# Patient Record
Sex: Female | Born: 1945 | Race: White | Hispanic: No | Marital: Married | Smoking: Former smoker
Health system: Southern US, Community
[De-identification: ages and names within clinical notes are randomized; demographics above are authoritative.]

## PROBLEM LIST (undated history)

## (undated) DIAGNOSIS — Z8719 Personal history of other diseases of the digestive system: Secondary | ICD-10-CM

## (undated) DIAGNOSIS — K219 Gastro-esophageal reflux disease without esophagitis: Secondary | ICD-10-CM

## (undated) DIAGNOSIS — K7460 Unspecified cirrhosis of liver: Secondary | ICD-10-CM

## (undated) DIAGNOSIS — F419 Anxiety disorder, unspecified: Secondary | ICD-10-CM

## (undated) DIAGNOSIS — Z9889 Other specified postprocedural states: Secondary | ICD-10-CM

## (undated) DIAGNOSIS — H9190 Unspecified hearing loss, unspecified ear: Secondary | ICD-10-CM

## (undated) DIAGNOSIS — H3530 Unspecified macular degeneration: Secondary | ICD-10-CM

## (undated) HISTORY — PX: OTHER SURGICAL HISTORY: SHX169

## (undated) HISTORY — DX: Unspecified macular degeneration: H35.30

---

## 1997-08-07 ENCOUNTER — Ambulatory Visit (HOSPITAL_COMMUNITY): Admission: RE | Admit: 1997-08-07 | Discharge: 1997-08-07 | Payer: Self-pay | Admitting: Obstetrics and Gynecology

## 1998-09-18 ENCOUNTER — Other Ambulatory Visit: Admission: RE | Admit: 1998-09-18 | Discharge: 1998-09-18 | Payer: Self-pay | Admitting: Obstetrics and Gynecology

## 1999-09-27 ENCOUNTER — Other Ambulatory Visit: Admission: RE | Admit: 1999-09-27 | Discharge: 1999-09-27 | Payer: Self-pay | Admitting: Obstetrics and Gynecology

## 2000-09-28 ENCOUNTER — Other Ambulatory Visit: Admission: RE | Admit: 2000-09-28 | Discharge: 2000-09-28 | Payer: Self-pay | Admitting: Obstetrics and Gynecology

## 2002-10-18 ENCOUNTER — Other Ambulatory Visit: Admission: RE | Admit: 2002-10-18 | Discharge: 2002-10-18 | Payer: Self-pay | Admitting: Obstetrics and Gynecology

## 2003-12-28 ENCOUNTER — Other Ambulatory Visit: Admission: RE | Admit: 2003-12-28 | Discharge: 2003-12-28 | Payer: Self-pay | Admitting: Obstetrics and Gynecology

## 2004-02-29 ENCOUNTER — Ambulatory Visit: Payer: Self-pay | Admitting: Cardiology

## 2004-12-12 ENCOUNTER — Inpatient Hospital Stay (HOSPITAL_COMMUNITY): Admission: AD | Admit: 2004-12-12 | Discharge: 2004-12-14 | Payer: Self-pay | Admitting: Internal Medicine

## 2005-05-13 ENCOUNTER — Other Ambulatory Visit: Admission: RE | Admit: 2005-05-13 | Discharge: 2005-05-13 | Payer: Self-pay | Admitting: Obstetrics and Gynecology

## 2005-07-29 ENCOUNTER — Other Ambulatory Visit: Admission: RE | Admit: 2005-07-29 | Discharge: 2005-07-29 | Payer: Self-pay | Admitting: Obstetrics and Gynecology

## 2008-06-20 ENCOUNTER — Observation Stay (HOSPITAL_COMMUNITY): Admission: EM | Admit: 2008-06-20 | Discharge: 2008-06-22 | Payer: Self-pay | Admitting: Emergency Medicine

## 2008-06-21 ENCOUNTER — Encounter (INDEPENDENT_AMBULATORY_CARE_PROVIDER_SITE_OTHER): Payer: Self-pay | Admitting: General Surgery

## 2009-04-21 HISTORY — PX: CHOLECYSTECTOMY: SHX55

## 2010-03-14 ENCOUNTER — Emergency Department (HOSPITAL_COMMUNITY): Admission: EM | Admit: 2010-03-14 | Discharge: 2010-03-14 | Payer: Self-pay | Admitting: Emergency Medicine

## 2010-07-02 LAB — URINALYSIS, ROUTINE W REFLEX MICROSCOPIC
Bilirubin Urine: NEGATIVE
Nitrite: NEGATIVE
Protein, ur: NEGATIVE mg/dL
Specific Gravity, Urine: <1.005 — ABNORMAL LOW (ref 1.005–1.030)
Urobilinogen, UA: 0.2 mg/dL (ref 0.0–1.0)

## 2010-07-02 LAB — URINE MICROSCOPIC-ADD ON

## 2010-08-01 LAB — DIFFERENTIAL
Basophils Absolute: 0 10*3/uL (ref 0.0–0.1)
Eosinophils Relative: 6 % — ABNORMAL HIGH (ref 0–5)
Lymphocytes Relative: 16 % (ref 12–46)
Lymphocytes Relative: 30 % (ref 12–46)
Lymphs Abs: 1.2 10*3/uL (ref 0.7–4.0)
Lymphs Abs: 1.6 10*3/uL (ref 0.7–4.0)
Neutro Abs: 2.9 10*3/uL (ref 1.7–7.7)
Neutrophils Relative %: 55 % (ref 43–77)
Neutrophils Relative %: 73 % (ref 43–77)

## 2010-08-01 LAB — COMPREHENSIVE METABOLIC PANEL
AST: 23 U/L (ref 0–37)
BUN: 13 mg/dL (ref 6–23)
CO2: 26 mEq/L (ref 19–32)
CO2: 28 mEq/L (ref 19–32)
Calcium: 8.4 mg/dL (ref 8.4–10.5)
Calcium: 9.2 mg/dL (ref 8.4–10.5)
Chloride: 103 mEq/L (ref 96–112)
Creatinine, Ser: 0.91 mg/dL (ref 0.4–1.2)
Creatinine, Ser: 0.98 mg/dL (ref 0.4–1.2)
GFR calc Af Amer: >60 mL/min (ref >60)
GFR calc non Af Amer: 57 mL/min — ABNORMAL LOW (ref >60)
GFR calc non Af Amer: >60 mL/min (ref >60)
Glucose, Bld: 117 mg/dL — ABNORMAL HIGH (ref 70–99)
Glucose, Bld: 125 mg/dL — ABNORMAL HIGH (ref 70–99)
Total Bilirubin: 0.6 mg/dL (ref 0.3–1.2)
Total Protein: 5.6 g/dL — ABNORMAL LOW (ref 6.0–8.3)

## 2010-08-01 LAB — CBC
HCT: 40.1 % (ref 36.0–46.0)
Hemoglobin: 13.2 g/dL (ref 12.0–15.0)
MCHC: 34.8 g/dL (ref 30.0–36.0)
MCHC: 35.6 g/dL (ref 30.0–36.0)
MCV: 97.8 fL (ref 78.0–100.0)
MCV: 99.1 fL (ref 78.0–100.0)
RBC: 3.83 MIL/uL — ABNORMAL LOW (ref 3.87–5.11)
RBC: 4.09 MIL/uL (ref 3.87–5.11)
RDW: 13.7 % (ref 11.5–15.5)
WBC: 5.3 10*3/uL (ref 4.0–10.5)

## 2010-08-01 LAB — POCT CARDIAC MARKERS
CKMB, poc: <1 ng/mL — ABNORMAL LOW (ref 1.0–8.0)
CKMB, poc: <1 ng/mL — ABNORMAL LOW (ref 1.0–8.0)
Troponin i, poc: <0.05 ng/mL (ref 0.00–0.09)
Troponin i, poc: <0.05 ng/mL (ref 0.00–0.09)

## 2010-08-01 LAB — PROTIME-INR: Prothrombin Time: 12.8 seconds (ref 11.6–15.2)

## 2010-08-01 LAB — PHOSPHORUS: Phosphorus: 4.1 mg/dL (ref 2.3–4.6)

## 2010-08-01 LAB — APTT: aPTT: 28 seconds (ref 24–37)

## 2010-08-01 LAB — LIPASE, BLOOD
Lipase: 26 U/L (ref 11–59)
Lipase: 40 U/L (ref 11–59)

## 2010-08-01 LAB — MAGNESIUM: Magnesium: 1.8 mg/dL (ref 1.5–2.5)

## 2010-09-03 NOTE — Group Therapy Note (Signed)
NAMEBRAYLINN, Nicole Barrett              ACCOUNT NO.:  [id number]   MEDICAL RECORD NO.:  [id number]          PATIENT TYPE:  INP   LOCATION:  A329                          FACILITY:  [facility]   PHYSICIAN:  Kingsley Callander. Ouida Sills, MD       DATE OF BIRTH:  01/06/1946   DATE OF PROCEDURE:  06/21/2008  DATE OF DISCHARGE:                                 PROGRESS NOTE   She has continued to feel pain overnight.  She has nausea this morning.   She has not been febrile.  There is no scleral icterus.  CHEST:  Clear.  HEART:  Regular.  ABDOMEN:  Tender in the epigastrium and right upper quadrant.   IMPRESSION:  Cholecystitis.  She had been seen in consultation by Dr.  Leticia Penna and was scheduled to have a cholecystectomy this morning.  Her  white count is normal.  Her lab work looks fine today.  I have discussed  her case with Dr. Leticia Penna.  There appear to be no contraindications to  proceeding with surgery.      Kingsley Callander. Ouida Sills, MD  Electronically Signed     ROF/MEDQ  D:  06/21/2008  T:  06/22/2008  Job:  [id number]

## 2010-09-03 NOTE — H&P (Signed)
NAMEVENESHA, Nicole Barrett              ACCOUNT NO.:  [id number]   MEDICAL RECORD NO.:  [id number]          PATIENT TYPE:  INP   LOCATION:  A329                          FACILITY:  [facility]   PHYSICIAN:  Kingsley Callander. Ouida Sills, MD       DATE OF BIRTH:  Jun 06, 1945   DATE OF ADMISSION:  06/20/2008  DATE OF DISCHARGE:  LH                              HISTORY & PHYSICAL   CHIEF COMPLAINT:  Abdominal pain.   HISTORY OF PRESENT ILLNESS:  This patient is a 65 year old white female  who presented to the emergency room with epigastric and right upper  quadrant pain which radiated around her sides to her back.  She had  experienced nausea, but no vomiting.  She denied fever.  She had not  experienced melena or hematochezia.  She has a history of GERD treated  with Prilosec.  She does not use NSAIDs or alcohol.  On looking back,  she has had some milder similar symptoms in the past.   PAST MEDICAL HISTORY:  1. Pyelonephritis requiring hospitalization in 2006.  2. Esophagitis.  3. Right hand cyst excision.   MEDICATIONS:  1. Prilosec 20 mg daily.  2. Glucosamine b.i.d.  3. Multivitamin daily.   ALLERGIES:  1. CODEINE.  2. SODIUM PENTOTHAL.   SOCIAL HISTORY:  She does not use tobacco or recreational drugs.  She  occasionally drinks a glass of wine.   FAMILY HISTORY:  Includes pancreatic cancer in her mother and heart  disease in her father.   REVIEW OF SYSTEMS:  She notes no change in her symptoms with position or  with meals.  Has not experienced angina.  No difficulty voiding.   PHYSICAL EXAMINATION:  VITAL SIGNS:  Temperature 97.6, blood pressure  134/75, pulse 64, respirations 16, oxygen saturation 100%.  GENERAL:  Uncomfortable-appearing white female.  HEENT:  No scleral icterus.  Pharynx is unremarkable.  NECK:  Supple with no JVD or thyromegaly.  LUNGS:  Clear.  HEART:  Regular with no murmurs.  ABDOMEN:  Tender in the epigastrium and right upper quadrant.  No  palpable mass or  hepatosplenomegaly.  No CVA tenderness.  EXTREMITIES:  Normal pulses.  No cyanosis, clubbing or edema.  NEURO:  Intact.  LYMPH NODES:  No cervical or supraclavicular enlargement.  SKIN:  Warm and dry.   LABORATORY DATA:  Sodium 139, potassium 3.8, bicarb 26, glucose 117, BUN  13, creatinine 0.98, calcium 9.2, SGOT 23, SGPT 29, alkaline phosphatase  77, INR 1.0, troponin-I less than 0.05, white count 5.3, hemoglobin  14.3, platelets 148,000.   DIAGNOSTICS:  1. Chest x-ray reveals no acute infiltrate.  2. A CT scan of the chest, abdomen and pelvis reveals no acute      abnormality.   IMPRESSION:  Cholecystitis.  Abdominal ultrasound reveals a 14 x 16  millimeter stone.  Gallbladder wall does not appear thickened.  Her  symptoms are consistent with cholecystitis.  She is afebrile now and has  a normal white count.  She will be seen in surgical consultation by Dr.  Leticia Penna.      Kingsley Callander. Ouida Sills,  MD  Electronically Signed     ROF/MEDQ  D:  06/21/2008  T:  06/21/2008  Job:  [id number]

## 2010-09-03 NOTE — Consult Note (Signed)
Nicole Barrett, Nicole Barrett              ACCOUNT NO.:  [id number]   MEDICAL RECORD NO.:  [id number]          PATIENT TYPE:  INP   LOCATION:  A329                          FACILITY:  [facility]   PHYSICIAN:  Tilford Pillar, MD      DATE OF BIRTH:  10-27-1945   DATE OF CONSULTATION:  06/20/2008  DATE OF DISCHARGE:                                 CONSULTATION   REFERRING PHYSICIAN:  Kingsley Callander. Ouida Sills, M.D.   REASON FOR CONSULTATION:  Abdominal pain.   HISTORY OF PRESENT ILLNESS:  The patient is a 65 year old female with a  history of gastroesophageal reflux disease who is otherwise healthy  presented with approximately 24 hours of increasing epigastric abdominal  pain.  This has been relatively constant.  She describes it as a sharp  pain with some radiation to the right subcostal and right flank region.  She has had similar but not as severe symptomatology over the last  month, although she denies any exacerbating or relieving factors  associated with it.  She has had nausea but no emesis.  She has had no  change in bowel movements, no hematochezia, no melena.  No diarrhea.  No  constipation.  She denies any significant similar history in the past  but does state occasional bloating in the past.  She has had no fever  and chills.  No history of jaundice.  No history of previous peptic  ulcer disease in reference to particular foods.  She has not noticed any  significant change with any particular food types, but states she has  not noted any increased symptoms with eating or with the oral  administration of a GI cocktail in the emergency department __________ .  She does have some slight family history of biliary disease.  No Native  American ancestry known in the family.   PAST MEDICAL HISTORY:  Gastroesophageal reflux disease.   PAST SURGICAL HISTORY:  She has had a previous cyst removed on her hand.   MEDICATIONS:  Prilosec, aspirin.   ALLERGIES:  CODEINE, which causes nausea and vomiting.   SOCIAL HISTORY:  No tobacco use, occasional alcohol use.  No  recreational drug use.   PHYSICAL EXAMINATION:  VITAL SIGNS:  Temperature 97.1, heart rate 72,  respiration rate is 20, blood pressure 146/76.  She is 96% O2 saturation  on room air.  GENERAL:  She is not in any acute distress.  She is alert and oriented  x3.  She is lying in supine position in the hospital bed.  Her husband  is present at bedside.  HEENT:  Scalp:  No deformities.  EYES:  Pupils equal, round, reactive.  Extraocular movements are intact.  No scleral icterus or conjunctival  pallor is noted.  Oral mucosa is pink.  Normal vision.  NECK:  Trachea is midline.  PULMONARY:  Unlabored respirations.  She is clear to auscultation  bilaterally.  CARDIOVASCULAR:  Regular rate and rhythm.  No murmurs or gallops are  apparent on evaluation.  She has 2+ radial pulses bilaterally.  ABDOMINAL EXAM:  She does have positive bowel sounds.  Abdomen is  soft,  moderately obese.  She does have moderate right upper quadrant abdominal  pain.  She does have positive Murphy sign elicited with deep  inspiration.  No masses or hernias are apparent.  No diffuse peritoneal  signs are encountered.  EXTREMITIES:  Warm and dry.   PERTINENT LABORATORY AND RADIOGRAPHIC STUDIES:  CBC:  White blood cell  count 5.3, hemoglobin 14.3, hematocrit 40.1, platelets 145.  Basic  metabolic panel:  Sodium 139, potassium 3.8, chloride 103, bicarb 26,  BUN 13, creatinine __________, blood glucose 117, alkaline phosphatase  is within normal limits at 77, AST 23, ALT 29, no elevation of total  bili or direct bilirubin or indirect bilirubinemias.   ASSESSMENT AND PLAN:  Acute cholecystitis:  At this time I did discuss  with the patient the risks, benefits, and alternatives of laparoscopic,  possible open cholecystectomy, including but not limited to the risk of  bleeding, infection, bile leak, small bowel injury, common bile duct  injury as well as  possibility of cardiac or pulmonary events.  At this  time I also discussed other potential diagnoses of epigastric abdominal  pain, although peptic ulcer or gastric etiology is extremely unlikely  due to the patient's nonresponsiveness to the GI cocktail.  I did  discuss with patient should she have any refractory symptomatology  following her procedure, that endoscopy may be considered in the future  for any unresolved symptomatology.  She understands and does wish to  proceed.  At this time will continue the patient on n.p.o. status,  continue IV fluids, DVT prophylaxis, and will plan to proceed to the  operating room as an add-on possibly in the morning.  Pain control will  be continued.  The patient's questions were answered.   I appreciate the opportunity of participating in this patient's care and  I will continue to follow the patient during her hospital course.      Tilford Pillar, MD  Electronically Signed     BZ/MEDQ  D:  06/21/2008  T:  06/21/2008  Job:  [id number]   cc:   Kingsley Callander. Ouida Sills, MD  Fax: [phone]

## 2010-09-03 NOTE — Op Note (Signed)
Nicole Barrett, Nicole Barrett              ACCOUNT NO.:  [id number]   MEDICAL RECORD NO.:  [id number]          PATIENT TYPE:  INP   LOCATION:  A329                          FACILITY:  [facility]   PHYSICIAN:  Tilford Pillar, MD      DATE OF BIRTH:  1945-05-25   DATE OF PROCEDURE:  06/21/2008  DATE OF DISCHARGE:                               OPERATIVE REPORT   PREOPERATIVE DIAGNOSIS:  Acute cholecystitis.   POSTOPERATIVE DIAGNOSIS:  Acute cholecystitis.   PROCEDURE:  Laparoscopic cholecystectomy.   SURGEON:  Tilford Pillar, MD   ANESTHESIA:  General endotracheal, local anesthetic, 0.5% Sensorcaine  plain.   SPECIMEN:  Gallbladder.   ESTIMATED BLOOD LOSS:  Minimal.   INDICATIONS:  The patient is a 65 year old female who presented to [facility] with continued epigastric abdominal pain that has been  going on for approximately 1 month.  Evaluation and workup with  suggestive of acute cholecystitis as our etiology.  The risks, benefits,  and alternatives of laparoscopic possible open cholecystectomy were  discussed at length with the patient and the patient's husband,  including but not limited to the risk of bleeding, infection, bile leak,  small bowel injury, bile duct injury as well as possibility of  intraoperative cardiac and pulmonary events.  The patient's and family's  questions and concerns were addressed.  The patient was consented for  planned procedure.   OPERATION:  The patient was taken to the operating and was placed in  supine position on the operating table, at which time the general  anesthetic was administered.  Once the patient was asleep, an  endotracheal tube was placed using a glide scope for assistance by  Anesthesia.  Once the patient was asleep and intubated, her abdomen was  prepped with DuraPrep solution and draped in standard fashion.  At this  time, a supraumbilical stab incision was created with an 11-blade  scalpel.  Additional dissection down through  subcuticular tissue was  carried out using a Kocher clamp which was utilized to grasp the  anterior abdominal fascia with this anteriorly.  A Veress needle was  inserted.  Saline drop test was utilized to confirm intraperitoneal  placement and then a pneumoperitoneum was initiated.  Once sufficient  pneumoperitoneum was obtained, an 11-mm trocar was inserted over.  A  laparoscope was allowing visualization of the trocar entering into  peritoneal cavity.  At this time, the inner cannula was removed.  The  laparoscope was reinserted.  There was no evidence any trocar or Veress  needle placement injury.  At this time, the remaining trocars were  placed and an 11-mm trocar in the epigastrium, a 5-mm trocar in the  midline between two 11-mm trocars and the 5 mm in the right lateral  abdominal wall.  The patient was placed in reverse Trendelenburg left  lateral decubitus position.  At this time, the fundus of the gallbladder  was identified and was clearly distended and pons.  It was not possible  to grasp the fundus with the radial grasper.  Therefore, Weck needle was  brought to the field and was utilized  to aspirate the contents of the  gallbladder.  The returning aspirate was clear yellow consistent with  hydrops, and at this time, the gallbladder was decompressed adequately  to allow the fundus to be grasped with a regular grasper.  The  gallbladder was lifted up and over the right lobe of the liver.  The  peritoneal reflection onto the infundibulum was bluntly stripped using  [place] dissector allowing exposure of the cystic duct entering into  the infundibulum.  At this time, a window was created behind the cystic  duct and cystic artery was similarly identified, a window was created  behind the cystic artery.  Three Endoclips were placed proximally on the  cystic duct.  One distally in the cystic duct which was divided between  2 most distal clips.  At this time, 2 Endoclips were placed  proximally  on the cystic artery, one distally, and the cystic artery was divided  between 2 most distal clips.  Posterior branch of the cystic artery was  also identified, this was clearly passing onto the posterior surface of  the gallbladder.  Similarly, a window was carried behind this.  Two  Endoclips were placed proximally, one distally, and this was divided  between 2 most distal clips.  At this time, electrocautery was utilized  to dissect the gallbladder free from gallbladder fossa.  Once it was  free, this was placed into an Endocatch bag and placed up and over the  right lobe of liver.  Electrocautery was utilized to obtain hemostasis  along the gallbladder fossa, and at this time I did reinspect the  Endoclips.  There were noted to be an excellent position.  There was no  evidence of any bleeding or bile leak.  At this time, I returned my  attention to closure.   At this time, using Endoclose suture passing device, I passed a 2-0  Vicryl sutures about the 11 mm trocar sites; with these sutures in  place, the gallbladder was grasped and removed through the epigastric  trocar site.  Some blunt dilatation was required in order to adequately  distend the epigastric trocar site enough to remove the gallbladder.  Once it was free, it was placed on the back table.  The gallbladder was  retrieved and intact.  Endocatch bag again was placed on the back table  and sent as permanent specimen to Pathology.  At this time, the  pneumoperitoneum was evacuated.  The Vicryl sutures were secured.  Local  anesthetic was instilled.  A 4-0 Monocryl was utilized to reapproximate  skin and skin edges in all 4 trocar sites.  The skin was washed and  dried with moistened and dry towel.  Benzoin was applied around  incision.  Half inch Steri-Strips was placed.  Drapes removed.  The  patient was allowed to come out from general anesthetic and was  transferred back to a regular hospital bed, and was  transferred to  postanesthetic care unit in stable condition.  At the conclusion of the  procedure, all instrument, sponge, and needle counts were correct.  The  patient tolerated the procedure well.       Tilford Pillar, MD  Electronically Signed     BZ/MEDQ  D:  06/21/2008  T:  06/22/2008  Job:  [id number]   cc:   Kingsley Callander. Ouida Sills, MD  Fax: [phone]

## 2010-09-06 NOTE — Discharge Summary (Signed)
Nicole Barrett, Nicole Barrett              ACCOUNT NO.:  [id number]   MEDICAL RECORD NO.:  [id number]          PATIENT TYPE:  INP   LOCATION:  A329                          FACILITY:  [facility]   PHYSICIAN:  Tilford Pillar, MD      DATE OF BIRTH:  1945-06-18   DATE OF ADMISSION:  06/20/2008  DATE OF DISCHARGE:  03/04/2010LH                               DISCHARGE SUMMARY   ADMISSION DIAGNOSIS:  Acute cholecystitis.   DISCHARGE DIAGNOSES:  1. Status post laparoscopic cholecystectomy.  2. Pyelonephritis.  3. History of esophagitis.   PROCEDURES:  Laparoscopic cholecystectomy.   DISPOSITION:  Home.   BRIEF HISTORY AND PHYSICAL:  Please see the admission history and  physical for the complete H and P.   The patient is a 65 year old female who presented to [facility]  Emergency Department with acute onset of right upper quadrant abdominal  pain consistent for acute cholecystitis.  She was admitted for continued  management and intervention.   HOSPITAL COURSE:  The patient was admitted.  She was taken to the  operating room on June 21, 2008, for a laparoscopic cholecystectomy.  She tolerated it well.  She spent a brief period in the post anesthetic  care unit and then was transferred back to surgical floor.  She  continued to improve well and was advanced on diet.  She she was  tolerating regular diet.  Pain was controlled on oral pain medications.  The patient was ambulatory.  She was discharged on June 22, 2008, to  home.   DISCHARGE INSTRUCTIONS:  The patient was instructed to increase  activities as tolerated.  She was not to lift anything greater than 20  pounds for the next 3 weeks.  She is not to drive while taking pain  medication.  She may shower, but she is not to soak the incisions for  about 3 weeks.  She was instructed she may wash the area with soap and  water.  She may remove adhesive dressing, Steri-Strips in 1-2 weeks.  She is to return to see me in my office in 3 weeks.   DISCHARGE MEDICATIONS:  1. She is to resume all previously prescribed home medications.  2. Lortab 5/500 1-2 p.o. q.4 h. p.r.n. pain.  3. Ibuprofen 400 mg p.o. q.6 h. p.r.n. pain.      Tilford Pillar, MD  Electronically Signed     BZ/MEDQ  D:  08/02/2008  T:  08/03/2008  Job:  [id number]

## 2010-09-06 NOTE — H&P (Signed)
Nicole Barrett, HARB              ACCOUNT NO.:  [id number]   MEDICAL RECORD NO.:  [id number]          PATIENT TYPE:  INP   LOCATION:  A319                          FACILITY:  [facility]   PHYSICIAN:  Kingsley Callander. Ouida Sills, MD       DATE OF BIRTH:  January 18, 1946   DATE OF ADMISSION:  12/12/2004  DATE OF DISCHARGE:  LH                                HISTORY & PHYSICAL   CHIEF COMPLAINT:  Nausea and vomiting.   HISTORY OF PRESENT ILLNESS:  This patient is a 65 year old white female who  presented to the office with a five day history of nausea and vomiting.  She  had also experienced fever, chills, rigors, and sweats.  She was found on  examination to have bilateral costovertebral angle tenderness.  Her  urinalysis revealed 2+ white cells and 3+ red cells with a positive nitrite.  The urine appeared hazy and grossly infected.  She was afebrile at the time  of the visit.  She appeared quite weak.  She had been having difficulty  eating.  She had vomited one hour prior to her visit.  She had not  experienced recent diarrhea.   PAST MEDICAL HISTORY:  1.  Esophagitis.  2.  Right hand cyst excision.   MEDICATIONS:  1.  Hormone replacement therapy.  2.  Prilosec.  3.  Claritin-D.  4.  Vitamin.   ALLERGIES:  1.  CODEINE.  2.  SODIUM PENTOTHAL.   SOCIAL HISTORY:  She does not smoke or drink.   FAMILY HISTORY:  Her mother died of pancreatic cancer.  Her father died of  heart disease.   REVIEW OF SYSTEMS:  Noncontributory.   PHYSICAL EXAMINATION:  VITAL SIGNS:  Temperature 97.5, weight 166.  GENERAL:  Ill-appearing, but well-oriented female.  HEENT:  No scleral icterus, pharynx is unremarkable.  NECK:  Supple with no JVD or thyromegaly.  LUNGS:  Clear.  HEART:  Tachycardic at 112 beats per minute.  ABDOMEN:  Soft, nontender, nondistended, no hepatosplenomegaly.  Bilateral  costovertebral angle tenderness is present.  EXTREMITIES:  No cyanosis, clubbing, or edema.  NEUROLOGIC:  Intact.  LYMPH  NODES:  No enlargement.   LABORATORY DATA:  UA as above.  CBC and comp are pending.   IMPRESSION:  Bilateral pyelonephritis.  She is being hospitalized for  treatment with intravenous antibiotics and intravenous fluids, plus  intravenous Phenergan and pain medications as needed.  We will obtain blood  and urine cultures initially.      Kingsley Callander. Ouida Sills, MD  Electronically Signed     ROF/MEDQ  D:  12/12/2004  T:  12/12/2004  Job:  [id number]

## 2010-09-06 NOTE — Discharge Summary (Signed)
Nicole Barrett, Nicole Barrett              ACCOUNT NO.:  [id number]   MEDICAL RECORD NO.:  [id number]          PATIENT TYPE:  INP   LOCATION:  A319                          FACILITY:  [facility]   PHYSICIAN:  Kingsley Callander. Ouida Sills, MD       DATE OF BIRTH:  [birth year]   DATE OF ADMISSION:  12/12/2004  DATE OF DISCHARGE:  08/26/2006LH                                 DISCHARGE SUMMARY   DISCHARGE DIAGNOSES:  1.  Pyelonephritis.  2.  Hypokalemia.   HOSPITAL COURSE:  This patient is a 65 year old female who presented with a  5-day history of nausea, vomiting, fevers and chills. She was found to have  exquisite tenderness in her costovertebral angles bilaterally. Her  urinalysis at the office revealed 2+ white cells and 3+ red cells. She was  hospitalized and started on IV fluids, Phenergan, Demerol and Levaquin. Her  white count was 10.7. Her BUN and creatinine were 11 and 0.9. She was  hyperglycemic at 162 initially and improved to 130. She was hyperkalemic  initially at 3.4 and improved to 3.7.   Her nausea and vomiting resolved. She was able to take clear liquids, then  solids. Her flank pain was much improved. She was stable for discharge to  continue oral outpatient therapy on the 26th.   She will be seen in follow-up in my office in two weeks.   DISCHARGE MEDICATIONS:  1.  Levaquin 500 mg q.24h. for 10 days.  2.  Claritin 10 mg q.d.  3.  Hormone replacement therapy.  4.  Prilosec 20 mg q.d.      Kingsley Callander. Ouida Sills, MD  Electronically Signed     ROF/MEDQ  D:  12/14/2004  T:  12/14/2004  Job:  [id number]

## 2011-06-03 DIAGNOSIS — J329 Chronic sinusitis, unspecified: Secondary | ICD-10-CM | POA: Diagnosis not present

## 2011-07-21 DIAGNOSIS — H2510 Age-related nuclear cataract, unspecified eye: Secondary | ICD-10-CM | POA: Diagnosis not present

## 2011-07-21 DIAGNOSIS — H524 Presbyopia: Secondary | ICD-10-CM | POA: Diagnosis not present

## 2011-08-05 DIAGNOSIS — H2510 Age-related nuclear cataract, unspecified eye: Secondary | ICD-10-CM | POA: Diagnosis not present

## 2011-08-05 DIAGNOSIS — H353190 Nonexudative age-related macular degeneration, unspecified eye, stage unspecified: Secondary | ICD-10-CM | POA: Diagnosis not present

## 2012-02-05 DIAGNOSIS — H353190 Nonexudative age-related macular degeneration, unspecified eye, stage unspecified: Secondary | ICD-10-CM | POA: Diagnosis not present

## 2012-02-05 DIAGNOSIS — H35369 Drusen (degenerative) of macula, unspecified eye: Secondary | ICD-10-CM | POA: Diagnosis not present

## 2012-02-05 DIAGNOSIS — H40019 Open angle with borderline findings, low risk, unspecified eye: Secondary | ICD-10-CM | POA: Diagnosis not present

## 2012-04-20 DIAGNOSIS — Z23 Encounter for immunization: Secondary | ICD-10-CM | POA: Diagnosis not present

## 2013-01-24 DIAGNOSIS — N390 Urinary tract infection, site not specified: Secondary | ICD-10-CM | POA: Diagnosis not present

## 2013-02-26 DIAGNOSIS — Z23 Encounter for immunization: Secondary | ICD-10-CM | POA: Diagnosis not present

## 2013-08-22 DIAGNOSIS — H353190 Nonexudative age-related macular degeneration, unspecified eye, stage unspecified: Secondary | ICD-10-CM | POA: Diagnosis not present

## 2013-08-22 DIAGNOSIS — H52229 Regular astigmatism, unspecified eye: Secondary | ICD-10-CM | POA: Diagnosis not present

## 2013-08-22 DIAGNOSIS — H2510 Age-related nuclear cataract, unspecified eye: Secondary | ICD-10-CM | POA: Diagnosis not present

## 2013-08-22 DIAGNOSIS — H5200 Hypermetropia, unspecified eye: Secondary | ICD-10-CM | POA: Diagnosis not present

## 2013-08-29 DIAGNOSIS — H35369 Drusen (degenerative) of macula, unspecified eye: Secondary | ICD-10-CM | POA: Diagnosis not present

## 2014-03-11 DIAGNOSIS — Z23 Encounter for immunization: Secondary | ICD-10-CM | POA: Diagnosis not present

## 2014-03-14 ENCOUNTER — Other Ambulatory Visit: Payer: Self-pay

## 2014-03-14 ENCOUNTER — Ambulatory Visit (INDEPENDENT_AMBULATORY_CARE_PROVIDER_SITE_OTHER): Payer: Medicare Other | Admitting: Gastroenterology

## 2014-03-14 ENCOUNTER — Encounter: Payer: Self-pay | Admitting: Gastroenterology

## 2014-03-14 VITALS — BP 156/87 | HR 87 | Temp 97.1°F | Ht 65.0 in | Wt 202.0 lb

## 2014-03-14 DIAGNOSIS — K625 Hemorrhage of anus and rectum: Secondary | ICD-10-CM

## 2014-03-14 DIAGNOSIS — K2100 Gastro-esophageal reflux disease with esophagitis, without bleeding: Secondary | ICD-10-CM

## 2014-03-14 DIAGNOSIS — R197 Diarrhea, unspecified: Secondary | ICD-10-CM | POA: Diagnosis not present

## 2014-03-14 DIAGNOSIS — K219 Gastro-esophageal reflux disease without esophagitis: Secondary | ICD-10-CM

## 2014-03-14 DIAGNOSIS — K529 Noninfective gastroenteritis and colitis, unspecified: Secondary | ICD-10-CM

## 2014-03-14 DIAGNOSIS — R1013 Epigastric pain: Secondary | ICD-10-CM

## 2014-03-14 DIAGNOSIS — R635 Abnormal weight gain: Secondary | ICD-10-CM | POA: Diagnosis not present

## 2014-03-14 LAB — CBC WITH DIFFERENTIAL/PLATELET
BASOS ABS: 0 10*3/uL (ref 0.0–0.1)
Basophils Relative: 0 % (ref 0–1)
EOS PCT: 4 % (ref 0–5)
Eosinophils Absolute: 0.2 10*3/uL (ref 0.0–0.7)
HCT: 41.2 % (ref 36.0–46.0)
Hemoglobin: 14.1 g/dL (ref 12.0–15.0)
LYMPHS ABS: 1.5 10*3/uL (ref 0.7–4.0)
Lymphocytes Relative: 27 % (ref 12–46)
MCH: 34.1 pg — AB (ref 26.0–34.0)
MCHC: 34.2 g/dL (ref 30.0–36.0)
MCV: 99.8 fL (ref 78.0–100.0)
MONO ABS: 0.5 10*3/uL (ref 0.1–1.0)
MONOS PCT: 9 % (ref 3–12)
MPV: 10.1 fL (ref 9.4–12.4)
NEUTROS PCT: 60 % (ref 43–77)
Neutro Abs: 3.4 10*3/uL (ref 1.7–7.7)
PLATELETS: 138 10*3/uL — AB (ref 150–400)
RBC: 4.13 MIL/uL (ref 3.87–5.11)
RDW: 13.1 % (ref 11.5–15.5)
WBC: 5.7 10*3/uL (ref 4.0–10.5)

## 2014-03-14 LAB — COMPREHENSIVE METABOLIC PANEL
ALBUMIN: 4 g/dL (ref 3.5–5.2)
ALT: 39 U/L — ABNORMAL HIGH (ref 0–35)
AST: 34 U/L (ref 0–37)
Alkaline Phosphatase: 69 U/L (ref 39–117)
BUN: 11 mg/dL (ref 6–23)
CHLORIDE: 105 meq/L (ref 96–112)
CO2: 26 mEq/L (ref 19–32)
CREATININE: 0.94 mg/dL (ref 0.50–1.10)
Calcium: 8.8 mg/dL (ref 8.4–10.5)
GLUCOSE: 106 mg/dL — AB (ref 70–99)
Potassium: 4.5 mEq/L (ref 3.5–5.3)
SODIUM: 142 meq/L (ref 135–145)
TOTAL PROTEIN: 6 g/dL (ref 6.0–8.3)
Total Bilirubin: 0.8 mg/dL (ref 0.2–1.2)

## 2014-03-14 LAB — TSH: TSH: 1.347 u[IU]/mL (ref 0.350–4.500)

## 2014-03-14 MED ORDER — PEG-KCL-NACL-NASULF-NA ASC-C 100 G PO SOLR: 1.0000 | ORAL | Status: DC

## 2014-03-14 NOTE — Progress Notes (Signed)
cc'ed to pcp °

## 2014-03-14 NOTE — Assessment & Plan Note (Signed)
Chronic somewhat self-limiting diarrhea, intermittent rectal bleeding. Possibly combination of medication effect, bile salt diarrhea, IBS. Recommend colonoscopy for further evaluation. Obtain routine labs.  I have discussed the risks, alternatives, benefits with regards to but not limited to the risk of reaction to medication, bleeding, infection, perforation and the patient is agreeable to proceed. Written consent to be obtained.

## 2014-03-14 NOTE — Progress Notes (Signed)
Primary Care Physician:  Asencion Noble, MD  Primary Gastroenterologist:  Garfield Cornea, MD   Chief Complaint  Patient presents with  . Digestion Problem    HPI:  Nicole Barrett is a 68 y.o. female here as a self-referral for further evaluation of chronic digestive issues, GERD. PCP, Dr. Willey Blade, but she hasn't seen in several years.   Progressively worse GI symptoms over the years. As a child was told she had a nervous stomach and was also placed on a "ulcer diet". In 20s, treated for "esophagitis" by PCP. Took Prilosec since it was available, currently on OTC Prilosec. No prior upper endoscopy or barium studies. Complains of excessive amounts of belching and indigestion. Has pressure/pain in the chest especially after meals. Denies typical heartburn. Hard to digest food after 1pm. Limits her diet to very bland and easily digestible foods. Has to consume fruits and vegetables prior to lunch. Increased discomfort if fasts. No dysphagia. No constipation. Lot of problems with diarrhea. Takes antidiarrheal medication when bad. Averages 4 BM/day. No nocturnal stools. Intermittent rectal bleeding. No melena. No NSAIDs/ASA. No prior colonoscopy. Previously failed Prevacid. Denies any weight loss. Has gained weight since she is retired. However weight is been stable more recently. Has been a long time since she's had blood work per patient.  Current Outpatient Prescriptions  Medication Sig Dispense Refill  . Calcium Carbonate-Vitamin D (CALCIUM + D PO) Take by mouth daily.    . Cetirizine HCl (ZYRTEC ALLERGY PO) Take by mouth as needed.    . Garlic (GARLIQUE PO) Take by mouth daily.    . Glucosamine-Chondroitin-Ca-D3 (TRIPLE FLEX 50+ PO) Take by mouth daily.    Marland Kitchen MELATONIN PO Take by mouth at bedtime.    . Multiple Vitamins-Minerals (PRESERVISION AREDS 2 PO) Take by mouth daily.    . Omega-3 Fatty Acids (RA FISH OIL) 900 MG CAPS Take by mouth daily.    Marland Kitchen omeprazole (PRILOSEC OTC) 20 MG tablet Take 20 mg  by mouth daily.    Marland Kitchen POTASSIUM CHLORIDE PO Take by mouth daily.     No current facility-administered medications for this visit.    Allergies as of 03/14/2014 - Review Complete 03/14/2014  Allergen Reaction Noted  . Codeine Nausea And Vomiting 03/14/2014    Past Medical History  Diagnosis Date  . Macular degeneration     Past Surgical History  Procedure Laterality Date  . Cholecystectomy  2011    Dr. Geroge Baseman    Family History  Problem Relation Age of Onset  . Colon cancer Neg Hx   . Pancreatic cancer Mother   . Diabetes    . Heart disease      History   Social History  . Marital Status: Married    Spouse Name: N/A    Number of Children: 1  . Years of Education: N/A   Occupational History  . Not on file.   Social History Main Topics  . Smoking status: Former Research scientist (life sciences)  . Smokeless tobacco: Not on file     Comment: Quit smokking 3 yrs ago  . Alcohol Use: 0.0 oz/week    0 Not specified per week     Comment: 2 glasses of wine daily  . Drug Use: No  . Sexual Activity: Not on file   Other Topics Concern  . Not on file   Social History Narrative  . No narrative on file      ROS:  General: Negative for anorexia, weight loss, fever, chills, fatigue, weakness. Eyes: Negative  for vision changes.  ENT: Negative for hoarseness, difficulty swallowing , nasal congestion. CV: Negative for chest pain, angina, palpitations, dyspnea on exertion, peripheral edema.  Respiratory: Negative for dyspnea at rest, dyspnea on exertion, cough, sputum, wheezing.  GI: See history of present illness. GU:  Negative for dysuria, hematuria, urinary incontinence, urinary frequency, nocturnal urination.  MS: Negative for joint pain, low back pain.  Derm: Negative for rash or itching.  Neuro: Negative for weakness, abnormal sensation, seizure, frequent headaches, memory loss, confusion.  Psych: Negative for anxiety, depression, suicidal ideation, hallucinations.  Endo: Negative for  unusual weight change.  Heme: Negative for bruising or bleeding. Allergy: Negative for rash or hives.    Physical Examination:  BP 156/87 mmHg  Pulse 87  Temp(Src) 97.1 F (36.2 C) (Oral)  Ht 5\' 5"  (1.651 m)  Wt 202 lb (91.627 kg)  BMI 33.61 kg/m2   General: Well-nourished, well-developed in no acute distress.  Head: Normocephalic, atraumatic.   Eyes: Conjunctiva pink, no icterus. Mouth: Oropharyngeal mucosa moist and pink , no lesions erythema or exudate. Neck: Supple without thyromegaly, masses, or lymphadenopathy.  Lungs: Clear to auscultation bilaterally.  Heart: Regular rate and rhythm, no murmurs rubs or gallops.  Abdomen: Bowel sounds are normal, nontender, nondistended, no hepatosplenomegaly or masses, no abdominal bruits or    hernia , no rebound or guarding.   Rectal: Not performed Extremities: No lower extremity edema. No clubbing or deformities.  Neuro: Alert and oriented x 4 , grossly normal neurologically.  Skin: Warm and dry, no rash or jaundice.   Psych: Alert and cooperative, normal mood and affect.

## 2014-03-14 NOTE — Patient Instructions (Signed)
1. Colonoscopy and upper endoscopy as planned. See separate instructions.  2. Please have your labs done. 3. Continue Prilosec for now. We may change it after your procedure based on the findings.

## 2014-03-14 NOTE — Assessment & Plan Note (Signed)
68 year old female with complaints of chronic postprandial dyspepsia especially with certain foods including dairy products, vegetables. No typical heartburn on Prilosec. She's been on Prilosec for more than 2 decades. No prior workup. Recommend first ever upper endoscopy to further evaluate her symptoms, rule out complicated GERD, malignancy, gastritis. We'll also obtain routine labs.  I have discussed the risks, alternatives, benefits with regards to but not limited to the risk of reaction to medication, bleeding, infection, perforation and the patient is agreeable to proceed. Written consent to be obtained.  Continue Prilosec OTC for now. Await endoscopic findings.

## 2014-03-15 LAB — TISSUE TRANSGLUTAMINASE, IGA: Tissue Transglutaminase Ab, IgA: <1 U/mL (ref <4)

## 2014-03-15 NOTE — Progress Notes (Signed)
Quick Note:  Please let patient know her celiac screen was negative. Thyroid was normal. Her platelet count is slightly low and her ALT few points high. (possibly related to fatty liver).  Await TCS/EGD findings, then we will decide further work up of above abnormalities. ______

## 2014-04-05 ENCOUNTER — Ambulatory Visit (HOSPITAL_COMMUNITY)
Admission: RE | Admit: 2014-04-05 | Discharge: 2014-04-05 | Disposition: A | Discharge status: Home / Self Care | Payer: Medicare Other | Source: Ambulatory Visit | Attending: Internal Medicine | Admitting: Internal Medicine

## 2014-04-05 ENCOUNTER — Encounter (HOSPITAL_COMMUNITY): Payer: Self-pay | Admitting: *Deleted

## 2014-04-05 ENCOUNTER — Encounter (HOSPITAL_COMMUNITY)
Admission: RE | Disposition: A | Discharge status: Home / Self Care | Payer: PRIVATE HEALTH INSURANCE | Source: Ambulatory Visit | Attending: Internal Medicine

## 2014-04-05 DIAGNOSIS — K529 Noninfective gastroenteritis and colitis, unspecified: Secondary | ICD-10-CM

## 2014-04-05 DIAGNOSIS — K2100 Gastro-esophageal reflux disease with esophagitis, without bleeding: Secondary | ICD-10-CM

## 2014-04-05 DIAGNOSIS — R197 Diarrhea, unspecified: Secondary | ICD-10-CM | POA: Diagnosis not present

## 2014-04-05 DIAGNOSIS — D123 Benign neoplasm of transverse colon: Secondary | ICD-10-CM

## 2014-04-05 DIAGNOSIS — K649 Unspecified hemorrhoids: Secondary | ICD-10-CM | POA: Insufficient documentation

## 2014-04-05 DIAGNOSIS — Z87891 Personal history of nicotine dependence: Secondary | ICD-10-CM | POA: Insufficient documentation

## 2014-04-05 DIAGNOSIS — R1013 Epigastric pain: Secondary | ICD-10-CM

## 2014-04-05 DIAGNOSIS — Z860100 Personal history of colon polyps, unspecified: Secondary | ICD-10-CM | POA: Insufficient documentation

## 2014-04-05 DIAGNOSIS — K625 Hemorrhage of anus and rectum: Secondary | ICD-10-CM | POA: Diagnosis not present

## 2014-04-05 DIAGNOSIS — K5730 Diverticulosis of large intestine without perforation or abscess without bleeding: Secondary | ICD-10-CM | POA: Insufficient documentation

## 2014-04-05 DIAGNOSIS — K30 Functional dyspepsia: Secondary | ICD-10-CM

## 2014-04-05 DIAGNOSIS — K219 Gastro-esophageal reflux disease without esophagitis: Secondary | ICD-10-CM | POA: Insufficient documentation

## 2014-04-05 DIAGNOSIS — Z1211 Encounter for screening for malignant neoplasm of colon: Secondary | ICD-10-CM | POA: Diagnosis not present

## 2014-04-05 DIAGNOSIS — K317 Polyp of stomach and duodenum: Secondary | ICD-10-CM | POA: Insufficient documentation

## 2014-04-05 DIAGNOSIS — D124 Benign neoplasm of descending colon: Secondary | ICD-10-CM | POA: Diagnosis not present

## 2014-04-05 DIAGNOSIS — K449 Diaphragmatic hernia without obstruction or gangrene: Secondary | ICD-10-CM | POA: Insufficient documentation

## 2014-04-05 HISTORY — DX: Anxiety disorder, unspecified: F41.9

## 2014-04-05 HISTORY — PX: ESOPHAGOGASTRODUODENOSCOPY: SHX5428

## 2014-04-05 HISTORY — PX: COLONOSCOPY: SHX5424

## 2014-04-05 SURGERY — COLONOSCOPY
Anesthesia: Moderate Sedation

## 2014-04-05 MED ORDER — MEPERIDINE HCL 100 MG/ML IJ SOLN
INTRAMUSCULAR | Status: DC | PRN
  Administered 2014-04-05 (×3): 25 mg via INTRAVENOUS

## 2014-04-05 MED ORDER — LIDOCAINE VISCOUS 2 % MT SOLN
OROMUCOSAL | Status: DC | PRN
  Administered 2014-04-05: 3 mL via OROMUCOSAL

## 2014-04-05 MED ORDER — MIDAZOLAM HCL 2 MG/2ML IJ SOLN
INTRAMUSCULAR | Status: AC
  Filled 2014-04-05: qty 2

## 2014-04-05 MED ORDER — MIDAZOLAM HCL 5 MG/5ML IJ SOLN
INTRAMUSCULAR | Status: DC | PRN
  Administered 2014-04-05: 2 mg via INTRAVENOUS
  Administered 2014-04-05 (×5): 1 mg via INTRAVENOUS

## 2014-04-05 MED ORDER — SODIUM CHLORIDE 0.9 % IV SOLN
INTRAVENOUS | Status: DC
  Administered 2014-04-05: 09:00:00 via INTRAVENOUS

## 2014-04-05 MED ORDER — MIDAZOLAM HCL 2 MG/2ML IJ SOLN
2.0000 mg | Freq: Once | INTRAMUSCULAR | Status: AC
  Administered 2014-04-05: 2 mg via INTRAVENOUS

## 2014-04-05 MED ORDER — ONDANSETRON HCL 4 MG/2ML IJ SOLN
INTRAMUSCULAR | Status: AC
  Filled 2014-04-05: qty 2

## 2014-04-05 MED ORDER — LIDOCAINE VISCOUS 2 % MT SOLN
OROMUCOSAL | Status: AC
  Filled 2014-04-05: qty 15

## 2014-04-05 MED ORDER — STERILE WATER FOR IRRIGATION IR SOLN
Status: DC | PRN
  Administered 2014-04-05: 09:00:00

## 2014-04-05 MED ORDER — MIDAZOLAM HCL 5 MG/5ML IJ SOLN
INTRAMUSCULAR | Status: AC
  Filled 2014-04-05: qty 10

## 2014-04-05 MED ORDER — ONDANSETRON HCL 4 MG/2ML IJ SOLN
INTRAMUSCULAR | Status: DC | PRN
  Administered 2014-04-05: 4 mg via INTRAVENOUS

## 2014-04-05 MED ORDER — MEPERIDINE HCL 100 MG/ML IJ SOLN
INTRAMUSCULAR | Status: AC
  Filled 2014-04-05: qty 2

## 2014-04-05 NOTE — Op Note (Signed)
[facility] [address] [place], [address]   COLONOSCOPY PROCEDURE REPORT  PATIENT: Nicole, Barrett  MR#: [id number] BIRTHDATE: 1945/08/20 , 68  yrs. old GENDER: female ENDOSCOPIST: R.  Garfield Cornea, MD FACP [facility] REFERRED BY:Roy Willey Blade, M.D. PROCEDURE DATE:  16-Apr-2014 PROCEDURE:   Colonoscopy with snare polypectomy and Colonoscopy with biopsy INDICATIONS:Chronic diarrhea; first ever screening colonoscopy. MEDICATIONS: Versed 9 mg IV and Demerol 75 mg IV in divided doses. Zofran 4 mg IV. ASA CLASS:       Class II  CONSENT: The risks, benefits, alternatives and imponderables including but not limited to bleeding, perforation as well as the possibility of a missed lesion have been reviewed.  The potential for biopsy, lesion removal, etc. have also been discussed. Questions have been answered.  All parties agreeable.  Please see the history and physical in the medical record for more information.  DESCRIPTION OF PROCEDURE:   After the risks benefits and alternatives of the procedure were thoroughly explained, informed consent was obtained.  The digital rectal exam revealed no abnormalities of the rectum.   The EC-[id number] ([id number])  endoscope was introduced through the anus and advanced to the cecum, which was identified by both the appendix and ileocecal valve. No adverse events experienced.   The quality of the prep was adequate.  The instrument was then slowly withdrawn as the colon was fully examined.    []   d. .  Withdrawal time=9 minutes 0 seconds.  The scope was withdrawn and the procedure completed. COMPLICATIONS: There were no immediate complications.  ENDOSCOPIC findings Anal canal hemorrhoids; otherwise, normal rectum.  Scattered pancolonic diverticula (left-sided greater than right).  (1) 5 mm polyp in the mid descending segment and (1) similar size polyp in the transverse segment; otherwise, the remainder of the colonic mucosa appeared  normal. The 2 above-mentioned polyps were cold snare removed. Segmental biopsies of the ascending and descending/sigmoid segments taken to assess for microscopic colitis  Impression: Colonic diverticulosis. Colonic polyps?"removed as described above. Status post segmental biopsy   RECOMMENDATIONS: Follow-up on pathology.  See EGD report.  eSigned:  R. Garfield Cornea, MD Rosalita Chessman [facility] 16-Apr-2014 10:22 AM   cc:  CPT CODES: ICD CODES:  The ICD and CPT codes recommended by this software are interpretations from the data that the clinical staff has captured with the software.  The verification of the translation of this report to the ICD and CPT codes and modifiers is the sole responsibility of the health care institution and practicing physician where this report was generated.  [employer] will not be held responsible for the validity of the ICD and CPT codes included on this report.  AMA assumes no liability for data contained or not contained herein. CPT is a Designer, television/film set of the [employer].  PATIENT NAME:  Nicole, Barrett MR#: [id number]

## 2014-04-05 NOTE — Interval H&P Note (Signed)
History and Physical Interval Note:  04/05/2014 9:19 AM  Nicole Barrett  has presented today for surgery, with the diagnosis of chronic GERD/chronic diarrhea/dyspepsia  The various methods of treatment have been discussed with the patient and family. After consideration of risks, benefits and other options for treatment, the patient has consented to  Procedure(s) with comments: COLONOSCOPY (N/A) - 0930 ESOPHAGOGASTRODUODENOSCOPY (EGD) (N/A) as a surgical intervention .  The patient's history has been reviewed, patient examined, no change in status, stable for surgery.  I have reviewed the patient's chart and labs.  Questions were answered to the patient's satisfaction.     Davita Sublett  No change. EGD and colonoscopy per plan.The risks, benefits, limitations, imponderables and alternatives regarding both EGD and colonoscopy have been reviewed with the patient. Questions have been answered. All parties agreeable.

## 2014-04-05 NOTE — H&P (View-Only) (Signed)
Primary Care Physician:  Asencion Noble, MD  Primary Gastroenterologist:  Garfield Cornea, MD   Chief Complaint  Patient presents with  . Digestion Problem    HPI:  Nicole Barrett is a 68 y.o. female here as a self-referral for further evaluation of chronic digestive issues, GERD. PCP, Dr. Willey Blade, but she hasn't seen in several years.   Progressively worse GI symptoms over the years. As a child was told she had a nervous stomach and was also placed on a "ulcer diet". In 20s, treated for "esophagitis" by PCP. Took Prilosec since it was available, currently on OTC Prilosec. No prior upper endoscopy or barium studies. Complains of excessive amounts of belching and indigestion. Has pressure/pain in the chest especially after meals. Denies typical heartburn. Hard to digest food after 1pm. Limits her diet to very bland and easily digestible foods. Has to consume fruits and vegetables prior to lunch. Increased discomfort if fasts. No dysphagia. No constipation. Lot of problems with diarrhea. Takes antidiarrheal medication when bad. Averages 4 BM/day. No nocturnal stools. Intermittent rectal bleeding. No melena. No NSAIDs/ASA. No prior colonoscopy. Previously failed Prevacid. Denies any weight loss. Has gained weight since she is retired. However weight is been stable more recently. Has been a long time since she's had blood work per patient.  Current Outpatient Prescriptions  Medication Sig Dispense Refill  . Calcium Carbonate-Vitamin D (CALCIUM + D PO) Take by mouth daily.    . Cetirizine HCl (ZYRTEC ALLERGY PO) Take by mouth as needed.    . Garlic (GARLIQUE PO) Take by mouth daily.    . Glucosamine-Chondroitin-Ca-D3 (TRIPLE FLEX 50+ PO) Take by mouth daily.    Marland Kitchen MELATONIN PO Take by mouth at bedtime.    . Multiple Vitamins-Minerals (PRESERVISION AREDS 2 PO) Take by mouth daily.    . Omega-3 Fatty Acids (RA FISH OIL) 900 MG CAPS Take by mouth daily.    Marland Kitchen omeprazole (PRILOSEC OTC) 20 MG tablet Take 20 mg  by mouth daily.    Marland Kitchen POTASSIUM CHLORIDE PO Take by mouth daily.     No current facility-administered medications for this visit.    Allergies as of 03/14/2014 - Review Complete 03/14/2014  Allergen Reaction Noted  . Codeine Nausea And Vomiting 03/14/2014    Past Medical History  Diagnosis Date  . Macular degeneration     Past Surgical History  Procedure Laterality Date  . Cholecystectomy  2011    Dr. Geroge Baseman    Family History  Problem Relation Age of Onset  . Colon cancer Neg Hx   . Pancreatic cancer Mother   . Diabetes    . Heart disease      History   Social History  . Marital Status: Married    Spouse Name: N/A    Number of Children: 1  . Years of Education: N/A   Occupational History  . Not on file.   Social History Main Topics  . Smoking status: Former Research scientist (life sciences)  . Smokeless tobacco: Not on file     Comment: Quit smokking 3 yrs ago  . Alcohol Use: 0.0 oz/week    0 Not specified per week     Comment: 2 glasses of wine daily  . Drug Use: No  . Sexual Activity: Not on file   Other Topics Concern  . Not on file   Social History Narrative  . No narrative on file      ROS:  General: Negative for anorexia, weight loss, fever, chills, fatigue, weakness. Eyes: Negative  for vision changes.  ENT: Negative for hoarseness, difficulty swallowing , nasal congestion. CV: Negative for chest pain, angina, palpitations, dyspnea on exertion, peripheral edema.  Respiratory: Negative for dyspnea at rest, dyspnea on exertion, cough, sputum, wheezing.  GI: See history of present illness. GU:  Negative for dysuria, hematuria, urinary incontinence, urinary frequency, nocturnal urination.  MS: Negative for joint pain, low back pain.  Derm: Negative for rash or itching.  Neuro: Negative for weakness, abnormal sensation, seizure, frequent headaches, memory loss, confusion.  Psych: Negative for anxiety, depression, suicidal ideation, hallucinations.  Endo: Negative for  unusual weight change.  Heme: Negative for bruising or bleeding. Allergy: Negative for rash or hives.    Physical Examination:  BP 156/87 mmHg  Pulse 87  Temp(Src) 97.1 F (36.2 C) (Oral)  Ht 5\' 5"  (1.651 m)  Wt 202 lb (91.627 kg)  BMI 33.61 kg/m2   General: Well-nourished, well-developed in no acute distress.  Head: Normocephalic, atraumatic.   Eyes: Conjunctiva pink, no icterus. Mouth: Oropharyngeal mucosa moist and pink , no lesions erythema or exudate. Neck: Supple without thyromegaly, masses, or lymphadenopathy.  Lungs: Clear to auscultation bilaterally.  Heart: Regular rate and rhythm, no murmurs rubs or gallops.  Abdomen: Bowel sounds are normal, nontender, nondistended, no hepatosplenomegaly or masses, no abdominal bruits or    hernia , no rebound or guarding.   Rectal: Not performed Extremities: No lower extremity edema. No clubbing or deformities.  Neuro: Alert and oriented x 4 , grossly normal neurologically.  Skin: Warm and dry, no rash or jaundice.   Psych: Alert and cooperative, normal mood and affect.

## 2014-04-05 NOTE — Discharge Instructions (Signed)
Colonoscopy Discharge Instructions  Read the instructions outlined below and refer to this sheet in the next few weeks. These discharge instructions provide you with general information on caring for yourself after you leave the hospital. Your doctor may also give you specific instructions. While your treatment has been planned according to the most current medical practices available, unavoidable complications occasionally occur. If you have any problems or questions after discharge, call Dr. Gala Romney at [phone]. ACTIVITY  You may resume your regular activity, but move at a slower pace for the next 24 hours.   Take frequent rest periods for the next 24 hours.   Walking will help get rid of the air and reduce the bloated feeling in your belly (abdomen).   No driving for 24 hours (because of the medicine (anesthesia) used during the test).    Do not sign any important legal documents or operate any machinery for 24 hours (because of the anesthesia used during the test).  NUTRITION  Drink plenty of fluids.   You may resume your normal diet as instructed by your doctor.   Begin with a light meal and progress to your normal diet. Heavy or fried foods are harder to digest and may make you feel sick to your stomach (nauseated).   Avoid alcoholic beverages for 24 hours or as instructed.  MEDICATIONS  You may resume your normal medications unless your doctor tells you otherwise.  WHAT YOU CAN EXPECT TODAY  Some feelings of bloating in the abdomen.   Passage of more gas than usual.   Spotting of blood in your stool or on the toilet paper.  IF YOU HAD POLYPS REMOVED DURING THE COLONOSCOPY:  No aspirin products for 7 days or as instructed.   No alcohol for 7 days or as instructed.   Eat a soft diet for the next 24 hours.  FINDING OUT THE RESULTS OF YOUR TEST Not all test results are available during your visit. If your test results are not back during the visit, make an appointment  with your caregiver to find out the results. Do not assume everything is normal if you have not heard from your caregiver or the medical facility. It is important for you to follow up on all of your test results.  SEEK IMMEDIATE MEDICAL ATTENTION IF:  You have more than a spotting of blood in your stool.   Your belly is swollen (abdominal distention).   You are nauseated or vomiting.   You have a temperature over 101.  You have abdominal pain or discomfort that is severe or gets worse throughout the day. EGD Discharge instructions Please read the instructions outlined below and refer to this sheet in the next few weeks. These discharge instructions provide you with general information on caring for yourself after you leave the hospital. Your doctor may also give you specific instructions. While your treatment has been planned according to the most current medical practices available, unavoidable complications occasionally occur. If you have any problems or questions after discharge, please call your doctor. ACTIVITY You may resume your regular activity but move at a slower pace for the next 24 hours.  Take frequent rest periods for the next 24 hours.  Walking will help expel (get rid of) the air and reduce the bloated feeling in your abdomen.  No driving for 24 hours (because of the anesthesia (medicine) used during the test).  You may shower.  Do not sign any important legal documents or operate any machinery for 24  hours (because of the anesthesia used during the test).  NUTRITION Drink plenty of fluids.  You may resume your normal diet.  Begin with a light meal and progress to your normal diet.  Avoid alcoholic beverages for 24 hours or as instructed by your caregiver.  MEDICATIONS You may resume your normal medications unless your caregiver tells you otherwise.  WHAT YOU CAN EXPECT TODAY You may experience abdominal discomfort such as a feeling of fullness or gas pains.   FOLLOW-UP Your doctor will discuss the results of your test with you.  SEEK IMMEDIATE MEDICAL ATTENTION IF ANY OF THE FOLLOWING OCCUR: Excessive nausea (feeling sick to your stomach) and/or vomiting.  Severe abdominal pain and distention (swelling).  Trouble swallowing.  Temperature over 101 F (37.8 C).  Rectal bleeding or vomiting of blood.    Diverticulosis, hemorrhoid and colon polyp information provided  GERD information provided  Further recommendations to follow pending review of pathology report  Prilosec; begin trial of Dexilant 60 mg daily  Office visit with [place] in 3 months Hemorrhoids Hemorrhoids are swollen veins around the rectum or anus. There are two types of hemorrhoids:  Internal hemorrhoids. These occur in the veins just inside the rectum. They may poke through to the outside and become irritated and painful. External hemorrhoids. These occur in the veins outside the anus and can be felt as a painful swelling or hard lump near the anus. CAUSES Pregnancy.  Obesity.  Constipation or diarrhea.  Straining to have a bowel movement.  Sitting for long periods on the toilet. Heavy lifting or other activity that caused you to strain. Anal intercourse. SYMPTOMS  Pain.  Anal itching or irritation.  Rectal bleeding.  Fecal leakage.  Anal swelling.  One or more lumps around the anus.  DIAGNOSIS  Your caregiver may be able to diagnose hemorrhoids by visual examination. Other examinations or tests that may be performed include:  Examination of the rectal area with a gloved hand (digital rectal exam).  Examination of anal canal using a small tube (scope).  A blood test if you have lost a significant amount of blood. A test to look inside the colon (sigmoidoscopy or colonoscopy). TREATMENT Most hemorrhoids can be treated at home. However, if symptoms do not seem to be getting better or if you have a lot of rectal bleeding, your caregiver may perform a  procedure to help make the hemorrhoids get smaller or remove them completely. Possible treatments include:  Placing a rubber band at the base of the hemorrhoid to cut off the circulation (rubber band ligation).  Injecting a chemical to shrink the hemorrhoid (sclerotherapy).  Using a tool to burn the hemorrhoid (infrared light therapy).  Surgically removing the hemorrhoid (hemorrhoidectomy).  Stapling the hemorrhoid to block blood flow to the tissue (hemorrhoid stapling).  HOME CARE INSTRUCTIONS  Eat foods with fiber, such as whole grains, beans, nuts, fruits, and vegetables. Ask your doctor about taking products with added fiber in them (fibersupplements). Increase fluid intake. Drink enough water and fluids to keep your urine clear or pale yellow.  Exercise regularly.  Go to the bathroom when you have the urge to have a bowel movement. Do not wait.  Avoid straining to have bowel movements.  Keep the anal area dry and clean. Use wet toilet paper or moist towelettes after a bowel movement.  Medicated creams and suppositories may be used or applied as directed.  Only take over-the-counter or prescription medicines as directed by your caregiver.  Take warm sitz  baths for 15-20 minutes, 3-4 times a day to ease pain and discomfort.  Place ice packs on the hemorrhoids if they are tender and swollen. Using ice packs between sitz baths may be helpful.  Put ice in a plastic bag.  Place a towel between your skin and the bag.  Leave the ice on for 15-20 minutes, 3-4 times a day.  Do not use a donut-shaped pillow or sit on the toilet for long periods. This increases blood pooling and pain.  SEEK MEDICAL CARE IF: You have increasing pain and swelling that is not controlled by treatment or medicine. You have uncontrolled bleeding. You have difficulty or you are unable to have a bowel movement. You have pain or inflammation outside the area of the hemorrhoids. MAKE SURE YOU: Understand  these instructions. Will watch your condition. Will get help right away if you are not doing well or get worse. Document Released: 04/04/2000 Document Revised: 03/24/2012 Document Reviewed: 02/10/2012 [facility] Patient Information 2015 [place], [place]. This information is not intended to replace advice given to you by your health care provider. Make sure you discuss any questions you have with your health care provider. Colon Polyps Polyps are lumps of extra tissue growing inside the body. Polyps can grow in the large intestine (colon). Most colon polyps are noncancerous (benign). However, some colon polyps can become cancerous over time. Polyps that are larger than a pea may be harmful. To be safe, caregivers remove and test all polyps. CAUSES  Polyps form when mutations in the genes cause your cells to grow and divide even though no more tissue is needed. RISK FACTORS There are a number of risk factors that can increase your chances of getting colon polyps. They include: Being older than 50 years. Family history of colon polyps or colon cancer. Long-term colon diseases, such as colitis or Crohn disease. Being overweight. Smoking. Being inactive. Drinking too much alcohol. SYMPTOMS  Most small polyps do not cause symptoms. If symptoms are present, they may include: Blood in the stool. The stool may look dark red or black. Constipation or diarrhea that lasts longer than 1 week. DIAGNOSIS People often do not know they have polyps until their caregiver finds them during a regular checkup. Your caregiver can use 4 tests to check for polyps: Digital rectal exam. The caregiver wears gloves and feels inside the rectum. This test would find polyps only in the rectum. Barium enema. The caregiver puts a liquid called barium into your rectum before taking X-rays of your colon. Barium makes your colon look white. Polyps are dark, so they are easy to see in the X-ray pictures. Sigmoidoscopy. A thin,  flexible tube (sigmoidoscope) is placed into your rectum. The sigmoidoscope has a light and tiny camera in it. The caregiver uses the sigmoidoscope to look at the last third of your colon. Colonoscopy. This test is like sigmoidoscopy, but the caregiver looks at the entire colon. This is the most common method for finding and removing polyps. TREATMENT  Any polyps will be removed during a sigmoidoscopy or colonoscopy. The polyps are then tested for cancer. PREVENTION  To help lower your risk of getting more colon polyps: Eat plenty of fruits and vegetables. Avoid eating fatty foods. Do not smoke. Avoid drinking alcohol. Exercise every day. Lose weight if recommended by your caregiver. Eat plenty of calcium and folate. Foods that are rich in calcium include milk, cheese, and broccoli. Foods that are rich in folate include chickpeas, kidney beans, and spinach. HOME CARE  INSTRUCTIONS Keep all follow-up appointments as directed by your caregiver. You may need periodic exams to check for polyps. SEEK MEDICAL CARE IF: You notice bleeding during a bowel movement. Document Released: 01/02/2004 Document Revised: 06/30/2011 Document Reviewed: 06/17/2011 [facility] Patient Information 2015 [place], [place]. This information is not intended to replace advice given to you by your health care provider. Make sure you discuss any questions you have with your health care provider. Diverticulosis Diverticulosis is the condition that develops when small pouches (diverticula) form in the wall of your colon. Your colon, or large intestine, is where water is absorbed and stool is formed. The pouches form when the inside layer of your colon pushes through weak spots in the outer layers of your colon. CAUSES  No one knows exactly what causes diverticulosis. RISK FACTORS Being older than 56. Your risk for this condition increases with age. Diverticulosis is rare in people younger than 40 years. By age 59, almost everyone  has it. Eating a low-fiber diet. Being frequently constipated. Being overweight. Not getting enough exercise. Smoking. Taking over-the-counter pain medicines, like aspirin and ibuprofen. SYMPTOMS  Most people with diverticulosis do not have symptoms. DIAGNOSIS  Because diverticulosis often has no symptoms, health care providers often discover the condition during an exam for other colon problems. In many cases, a health care provider will diagnose diverticulosis while using a flexible scope to examine the colon (colonoscopy). TREATMENT  If you have never developed an infection related to diverticulosis, you may not need treatment. If you have had an infection before, treatment may include: Eating more fruits, vegetables, and grains. Taking a fiber supplement. Taking a live bacteria supplement (probiotic). Taking medicine to relax your colon. HOME CARE INSTRUCTIONS  Drink at least 6-8 glasses of water each day to prevent constipation. Try not to strain when you have a bowel movement. Keep all follow-up appointments. If you have had an infection before: Increase the fiber in your diet as directed by your health care provider or dietitian. Take a dietary fiber supplement if your health care provider approves. Only take medicines as directed by your health care provider. SEEK MEDICAL CARE IF:  You have abdominal pain. You have bloating. You have cramps. You have not gone to the bathroom in 3 days. SEEK IMMEDIATE MEDICAL CARE IF:  Your pain gets worse. Yourbloating becomes very bad. You have a fever or chills, and your symptoms suddenly get worse. You begin vomiting. You have bowel movements that are bloody or black. MAKE SURE YOU: Understand these instructions. Will watch your condition. Will get help right away if you are not doing well or get worse. Document Released: 01/03/2004 Document Revised: 04/12/2013 Document Reviewed: 03/02/2013 [facility] Patient Information 2015  [place], [place]. This information is not intended to replace advice given to you by your health care provider. Make sure you discuss any questions you have with your health care provider.

## 2014-04-05 NOTE — Op Note (Signed)
[facility] [address] [place], [address]   ENDOSCOPY PROCEDURE REPORT  PATIENT: Nicole Barrett, Nicole Barrett  MR#: [id number] BIRTHDATE: 1946-01-25 , 68  yrs. old GENDER: female ENDOSCOPIST: R.  Garfield Cornea, MD FACP FACG REFERRED BY:  Asencion Noble, M.D. PROCEDURE DATE:  April 27, 2014 PROCEDURE:  EGD w/ biopsy INDICATIONS:  Dyspepsia; GERD; denies dysphagia. MEDICATIONS: Versed 6 mg IV and Demerol 75 mg IV in the divided doses.  Zofran 4 mg IV.  Xylocaine gel orally. ASA CLASS:      Class II  CONSENT: The risks, benefits, limitations, alternatives and imponderables have been discussed.  The potential for biopsy, esophogeal dilation, etc. have also been reviewed.  Questions have been answered.  All parties agreeable.  Please see the history and physical in the medical record for more information.  DESCRIPTION OF PROCEDURE: After the risks benefits and alternatives of the procedure were thoroughly explained, informed consent was obtained.  The EG-[id number] ([id number]) endoscope was introduced through the mouth and advanced to the second portion of the duodenum , limited by Without limitations. The instrument was slowly withdrawn as the mucosa was fully examined.    Noncritical Schatzki's ring; otherwise, normal-appearing esophagus. Stomach empty.  3 cm hiatal hernia.  patchy erythema and some almost "fish scale" appearance of the gastric mucosa.  No ulcer or infiltrating process.  A couple of hyperplastic gastric polyps present.  Patent pylorus.  Normal first and second portion of the duodenum.  Biopsies of abnormal gastric mucosa and one of the polyps taken separately.  Retroflexed views revealed as previously described. The scope was then withdrawn from the patient and the procedure completed.  COMPLICATIONS: There were no immediate complications.  ENDOSCOPIC IMPRESSION: Noncritical Schatzki's ring?"not manipulated. Hiatal hernia. Abnormal gastric mucosa with gastric  polyps?"status post biopsy  RECOMMENDATIONS: Stop Prilosec; trial of Dexilant 60 mg daily. Follow up on pathology. See colonoscopy report.  REPEAT EXAM:  eSigned:  R. Garfield Cornea, MD Rosalita Chessman [facility] 27-Apr-2014 9:45 AM    CC:  CPT CODES: ICD CODES:  The ICD and CPT codes recommended by this software are interpretations from the data that the clinical staff has captured with the software.  The verification of the translation of this report to the ICD and CPT codes and modifiers is the sole responsibility of the health care institution and practicing physician where this report was generated.  [employer] will not be held responsible for the validity of the ICD and CPT codes included on this report.  AMA assumes no liability for data contained or not contained herein. CPT is a Designer, television/film set of the [employer].  PATIENT NAME:  [name], [name] MR#: [id number]

## 2014-04-07 ENCOUNTER — Telehealth: Payer: Self-pay

## 2014-04-07 ENCOUNTER — Encounter (HOSPITAL_COMMUNITY): Payer: Self-pay | Admitting: Internal Medicine

## 2014-04-07 ENCOUNTER — Encounter: Payer: Self-pay | Admitting: Internal Medicine

## 2014-04-07 NOTE — Telephone Encounter (Signed)
Per RMR- Send letter to patient.  Send copy of letter with path to referring provider and PCP.  Needs OV in 3 months with extender

## 2014-04-07 NOTE — Telephone Encounter (Signed)
Letter mailed to pt.  

## 2014-04-10 NOTE — Telephone Encounter (Signed)
APPOINTMENT MADE °

## 2014-05-18 NOTE — Progress Notes (Signed)
Quick Note:  Please let pt know, I reviewed EGD/TCS findings.  She had minimally elevated ALT and slightly low platelet count. H/O of fatty liver 2010. Recent EGD without findings to suggest advanced liver disease.   I would recommend she have the following prior to her 06/2014 appointment. -LFTs, CBC, Hep B surf Ag, HCV Ab, iron/TIBC, ferritin. -abdominal U/S: for abnormal LFTs, thrombocytopenia. ______

## 2014-05-22 ENCOUNTER — Other Ambulatory Visit: Payer: Self-pay | Admitting: Gastroenterology

## 2014-05-22 DIAGNOSIS — R7989 Other specified abnormal findings of blood chemistry: Secondary | ICD-10-CM

## 2014-05-22 DIAGNOSIS — R945 Abnormal results of liver function studies: Secondary | ICD-10-CM

## 2014-05-22 DIAGNOSIS — D696 Thrombocytopenia, unspecified: Secondary | ICD-10-CM

## 2014-06-12 ENCOUNTER — Other Ambulatory Visit: Payer: Self-pay

## 2014-06-12 DIAGNOSIS — R7989 Other specified abnormal findings of blood chemistry: Secondary | ICD-10-CM

## 2014-06-12 DIAGNOSIS — R945 Abnormal results of liver function studies: Secondary | ICD-10-CM

## 2014-06-12 DIAGNOSIS — D696 Thrombocytopenia, unspecified: Secondary | ICD-10-CM

## 2014-06-13 ENCOUNTER — Other Ambulatory Visit: Payer: Self-pay

## 2014-06-13 DIAGNOSIS — R7989 Other specified abnormal findings of blood chemistry: Secondary | ICD-10-CM

## 2014-06-13 DIAGNOSIS — D696 Thrombocytopenia, unspecified: Secondary | ICD-10-CM

## 2014-06-13 DIAGNOSIS — R945 Abnormal results of liver function studies: Secondary | ICD-10-CM

## 2014-06-26 ENCOUNTER — Other Ambulatory Visit (HOSPITAL_COMMUNITY): Payer: PRIVATE HEALTH INSURANCE

## 2014-06-26 ENCOUNTER — Ambulatory Visit (HOSPITAL_COMMUNITY): Admission: RE | Admit: 2014-06-26 | Payer: Medicare Other | Source: Ambulatory Visit

## 2014-06-27 ENCOUNTER — Ambulatory Visit (HOSPITAL_COMMUNITY)
Admission: RE | Admit: 2014-06-27 | Discharge: 2014-06-27 | Disposition: A | Discharge status: Home / Self Care | Payer: Medicare Other | Source: Ambulatory Visit | Attending: Gastroenterology | Admitting: Gastroenterology

## 2014-06-27 DIAGNOSIS — R945 Abnormal results of liver function studies: Secondary | ICD-10-CM

## 2014-06-27 DIAGNOSIS — R7989 Other specified abnormal findings of blood chemistry: Secondary | ICD-10-CM | POA: Diagnosis present

## 2014-06-27 DIAGNOSIS — N281 Cyst of kidney, acquired: Secondary | ICD-10-CM | POA: Insufficient documentation

## 2014-06-27 DIAGNOSIS — R932 Abnormal findings on diagnostic imaging of liver and biliary tract: Secondary | ICD-10-CM | POA: Diagnosis not present

## 2014-06-27 DIAGNOSIS — D696 Thrombocytopenia, unspecified: Secondary | ICD-10-CM | POA: Diagnosis not present

## 2014-06-27 DIAGNOSIS — R161 Splenomegaly, not elsewhere classified: Secondary | ICD-10-CM | POA: Insufficient documentation

## 2014-06-27 DIAGNOSIS — Z9049 Acquired absence of other specified parts of digestive tract: Secondary | ICD-10-CM | POA: Insufficient documentation

## 2014-06-29 NOTE — Progress Notes (Signed)
Quick Note:  Fatty liver, possibly some cirrhosis.  She needs to have her labs done prior to her upcoming OV so we can go over results. Need to also get a PT/INR and Creatinine with labs that are already ordered. ______

## 2014-06-30 ENCOUNTER — Other Ambulatory Visit: Payer: Self-pay

## 2014-06-30 DIAGNOSIS — K760 Fatty (change of) liver, not elsewhere classified: Secondary | ICD-10-CM

## 2014-06-30 DIAGNOSIS — D696 Thrombocytopenia, unspecified: Secondary | ICD-10-CM | POA: Diagnosis not present

## 2014-06-30 DIAGNOSIS — R799 Abnormal finding of blood chemistry, unspecified: Secondary | ICD-10-CM | POA: Diagnosis not present

## 2014-06-30 DIAGNOSIS — R7989 Other specified abnormal findings of blood chemistry: Secondary | ICD-10-CM | POA: Diagnosis not present

## 2014-06-30 LAB — CBC WITH DIFFERENTIAL/PLATELET
BASOS PCT: 1 % (ref 0–1)
Basophils Absolute: 0.1 10*3/uL (ref 0.0–0.1)
EOS ABS: 0.2 10*3/uL (ref 0.0–0.7)
EOS PCT: 4 % (ref 0–5)
HCT: 43.4 % (ref 36.0–46.0)
HEMOGLOBIN: 14.4 g/dL (ref 12.0–15.0)
LYMPHS ABS: 1.4 10*3/uL (ref 0.7–4.0)
Lymphocytes Relative: 27 % (ref 12–46)
MCH: 33.9 pg (ref 26.0–34.0)
MCHC: 33.2 g/dL (ref 30.0–36.0)
MCV: 102.1 fL — AB (ref 78.0–100.0)
MPV: 10.1 fL (ref 8.6–12.4)
Monocytes Absolute: 0.4 10*3/uL (ref 0.1–1.0)
Monocytes Relative: 7 % (ref 3–12)
Neutro Abs: 3.1 10*3/uL (ref 1.7–7.7)
Neutrophils Relative %: 61 % (ref 43–77)
Platelets: 138 10*3/uL — ABNORMAL LOW (ref 150–400)
RBC: 4.25 MIL/uL (ref 3.87–5.11)
RDW: 13.6 % (ref 11.5–15.5)
WBC: 5 10*3/uL (ref 4.0–10.5)

## 2014-06-30 NOTE — Progress Notes (Signed)
Quick Note:  Pt is aware of results. She was getting ready to go do labs. I reminded her she needed the additional labs and to please inform the lab. Orders have been faxed. ______

## 2014-07-01 LAB — HEPATIC FUNCTION PANEL
ALT: 22 U/L (ref 0–35)
AST: 20 U/L (ref 0–37)
Albumin: 4 g/dL (ref 3.5–5.2)
Alkaline Phosphatase: 66 U/L (ref 39–117)
BILIRUBIN DIRECT: 0.2 mg/dL (ref 0.0–0.3)
Indirect Bilirubin: 0.7 mg/dL (ref 0.2–1.2)
TOTAL PROTEIN: 6.1 g/dL (ref 6.0–8.3)
Total Bilirubin: 0.9 mg/dL (ref 0.2–1.2)

## 2014-07-01 LAB — PROTIME-INR
INR: 1.09 (ref <1.50)
Prothrombin Time: 14.1 seconds (ref 11.6–15.2)

## 2014-07-01 LAB — IRON AND TIBC
%SAT: 42 % (ref 20–55)
Iron: 121 ug/dL (ref 42–145)
TIBC: 291 ug/dL (ref 250–470)
UIBC: 170 ug/dL (ref 125–400)

## 2014-07-01 LAB — CREATININE, SERUM: Creat: 0.81 mg/dL (ref 0.50–1.10)

## 2014-07-01 LAB — HEPATITIS B SURFACE ANTIGEN: Hepatitis B Surface Ag: NEGATIVE

## 2014-07-01 LAB — FERRITIN: Ferritin: 312 ng/mL — ABNORMAL HIGH (ref 10–291)

## 2014-07-01 LAB — HEPATITIS C ANTIBODY: HCV Ab: NEGATIVE

## 2014-07-01 LAB — IRON: IRON: 128 ug/dL (ref 42–145)

## 2014-07-05 ENCOUNTER — Ambulatory Visit (INDEPENDENT_AMBULATORY_CARE_PROVIDER_SITE_OTHER): Payer: Medicare Other | Admitting: Gastroenterology

## 2014-07-05 ENCOUNTER — Encounter: Payer: Self-pay | Admitting: Gastroenterology

## 2014-07-05 ENCOUNTER — Other Ambulatory Visit: Payer: Self-pay | Admitting: Gastroenterology

## 2014-07-05 VITALS — BP 151/86 | HR 85 | Temp 97.8°F | Ht 65.0 in | Wt 198.6 lb

## 2014-07-05 DIAGNOSIS — K219 Gastro-esophageal reflux disease without esophagitis: Secondary | ICD-10-CM

## 2014-07-05 DIAGNOSIS — K760 Fatty (change of) liver, not elsewhere classified: Secondary | ICD-10-CM | POA: Insufficient documentation

## 2014-07-05 DIAGNOSIS — R1013 Epigastric pain: Secondary | ICD-10-CM | POA: Diagnosis not present

## 2014-07-05 MED ORDER — PANTOPRAZOLE SODIUM 40 MG PO TBEC: 40.0000 mg | DELAYED_RELEASE_TABLET | Freq: Two times a day (BID) | ORAL | Status: DC

## 2014-07-05 NOTE — Assessment & Plan Note (Signed)
Symptoms improved but not resolved on Dexilant. Unable to afford Dexilant any furher. Trial of pantoprazole 40mg  BID. RX provided. Call if ongoing symptoms on difficulty obtaining medication.

## 2014-07-05 NOTE — Patient Instructions (Signed)
1. Please have your labs done in 3 months. We will send you a reminder letter.  2. Stop dexilant. Start pantoprazole 40mg  before breakfast and 40mg  before evening meal. PLEASE CALL IF THIS IS TOO EXPENSIVE. 3. Return to the office in 6 months or call sooner if needed.  Instructions for fatty liver: Recommend 1-2# weight loss per week until ideal body weight through exercise & diet. Low fat/cholesterol diet.   Avoid sweets, sodas, fruit juices, sweetened beverages like tea, etc. Gradually increase exercise from 15 min daily up to 1 hr per day 5 days/week. Limit alcohol use.

## 2014-07-05 NOTE — Progress Notes (Signed)
cc'ed to pcp °

## 2014-07-05 NOTE — Progress Notes (Signed)
Primary Care Physician: Asencion Noble, MD  Primary Gastroenterologist:  Garfield Cornea, MD   Chief Complaint  Patient presents with  . Follow-up    doing ok    HPI: Nicole Barrett is a 69 y.o. female here for follow up of chronic digestive issues, GERD, diarrhea.      . Colonoscopy N/A 04/05/2014    [facility]:NOBSJGG diverticulosis,colonic polyps removed (TA), random colon biopsies negative  . Esophagogastroduodenoscopy N/A 04/05/2014    [facility]:[id number] schaztki's ring/HH, abnormal gastric mucosa with some fish scales appearance with gastric polyps (benign)   Since her OV she had labs that indicated mild thrombocytopenia, mildly elevated ALT. Subsequently had abd u/s that showed mildly nodular hepatic contour and fatty liver appearance. Her Hep B/C viral markers were negative. Ferritin mildly elevated, but iron normal. INR normal. Intentional five pound weight loss.   Overall feels better. Periods of diarrhea much less since off of Prilosec. Takes antidiarrheal as needed. Dexilant helped her dyspepsia and substernal discomfort a lot but still with symptoms at times in the afternoons/evenings if eats a lot of fiber at 3pm. Dexilant costs too much to continue, over $80/month.     Current Outpatient Prescriptions  Medication Sig Dispense Refill  . Calcium Carbonate-Vitamin D (CALCIUM + D PO) Take by mouth daily.    Marland Kitchen dexlansoprazole (DEXILANT) 60 MG capsule Take 60 mg by mouth daily.    . fluticasone (FLONASE) 50 MCG/ACT nasal spray Place 1 spray into both nostrils daily.    . Garlic (GARLIQUE PO) Take 1 capsule by mouth daily.     . Glucosamine-Chondroitin-Ca-D3 (TRIPLE FLEX 50+ PO) Take 1 tablet by mouth daily.     Marland Kitchen MELATONIN PO Take 1 tablet by mouth at bedtime.     . Multiple Vitamins-Minerals (PRESERVISION AREDS 2 PO) Take 1 capsule by mouth daily.     . Omega-3 Fatty Acids (RA FISH OIL) 900 MG CAPS Take 1 capsule by mouth daily.     Marland Kitchen POTASSIUM CHLORIDE PO Take 1 tablet by mouth  daily.      No current facility-administered medications for this visit.    Allergies as of 07/05/2014 - Review Complete 07/05/2014  Allergen Reaction Noted  . Codeine Nausea And Vomiting 03/14/2014    ROS:  General: Negative for anorexia, weight loss, fever, chills, fatigue, weakness. ENT: Negative for hoarseness, difficulty swallowing , nasal congestion. CV: Negative for chest pain, angina, palpitations, dyspnea on exertion, peripheral edema.  Respiratory: Negative for dyspnea at rest, dyspnea on exertion, cough, sputum, wheezing.  GI: See history of present illness. GU:  Negative for dysuria, hematuria, urinary incontinence, urinary frequency, nocturnal urination.  Endo: Negative for unusual weight change.    Physical Examination:   BP 151/86 mmHg  Pulse 85  Temp(Src) 97.8 F (36.6 C) (Oral)  Ht 5\' 5"  (1.651 m)  Wt 198 lb 9.6 oz (90.084 kg)  BMI 33.05 kg/m2  General: Well-nourished, well-developed in no acute distress.  Eyes: No icterus. Mouth: Oropharyngeal mucosa moist and pink , no lesions erythema or exudate. Lungs: Clear to auscultation bilaterally.  Heart: Regular rate and rhythm, no murmurs rubs or gallops.  Abdomen: Bowel sounds are normal, nontender, nondistended, no hepatosplenomegaly or masses, no abdominal bruits or hernia , no rebound or guarding.   Extremities: No lower extremity edema. No clubbing or deformities. Neuro: Alert and oriented x 4   Skin: Warm and dry, no jaundice.   Psych: Alert and cooperative, normal mood and affect.  Labs:   Lab  Results  Component Value Date   IRON 121 06/30/2014   TIBC 291 06/30/2014   FERRITIN 312* 06/30/2014   HCV ab negative Hep B surf ag negative  Lab Results  Component Value Date   ALT 22 06/30/2014   AST 20 06/30/2014   ALKPHOS 66 06/30/2014   BILITOT 0.9 06/30/2014   Lab Results  Component Value Date   INR 1.09 06/30/2014   INR 1.0 06/20/2008   Lab Results  Component Value Date   WBC 5.0  06/30/2014   HGB 14.4 06/30/2014   HCT 43.4 06/30/2014   MCV 102.1* 06/30/2014   PLT 138* 06/30/2014    Imaging Studies: US Abdomen Complete  06/27/2014   CLINICAL DATA:  Abnormal LFTs, thrombocytopenia, prior cholecystectomy  EXAM: ULTRASOUND ABDOMEN COMPLETE  COMPARISON:  None.  FINDINGS: Gallbladder: Surgically absent.  Common bile duct: Diameter: 5 mm  Liver: Hyperechoic hepatic parenchyma. Coarse echogenicity with mildly nodular hepatic contour. No focal hepatic lesion is seen.  IVC: No abnormality visualized.  Pancreas: Visualized portion unremarkable.  Spleen: Enlarged, measuring 11.1 x 14.1 x 5.4 cm (calculated volume 443 mL).  Right Kidney: Length: 12.2 cm. 6.3 x 4.6 x 6.6 cm right upper pole renal cyst a single thin septation. No hydronephrosis.  Left Kidney: Length: 12.6 cm. 9 mm upper pole renal calculus. No hydronephrosis.  Abdominal aorta: No aneurysm visualized.  Other findings: None.  IMPRESSION: Hepatic steatosis and/or cirrhosis. No focal hepatic lesion is seen.  Mild splenomegaly.  Status post cholecystectomy.  6.6 cm right upper pole renal cyst with a single thin septation, benign.  Suspected 9 mm lateral renal calculus.  No hydronephrosis.   Electronically Signed   By: Julian Hy M.D.   On: 06/27/2014 17:49

## 2014-07-05 NOTE — Assessment & Plan Note (Signed)
Mild thrombocytopenia led to abd u/s with suggestion of fatty liver and/or cirrhosis. Mild splenomegaly. Negative viral markers. Ferritin mildly elevated and may be insignificant.   Instructions for fatty liver: Recommend 1-2# weight loss per week until ideal body weight through exercise & diet. Low fat/cholesterol diet.   Avoid sweets, sodas, fruit juices, sweetened beverages like tea, etc. Gradually increase exercise from 15 min daily up to 1 hr per day 5 days/week. Limit alcohol use.   Repeat ferritin, iron/tibc, hep a total, Hep B surface Ab in 3 months.  Return to office in 6 months.

## 2014-07-05 NOTE — Progress Notes (Signed)
We need to check Hep B surface Antibody and Hep A total Ab when she gets her iron/tibc, ferritin in 3 months.  Please send her some information on fatty liver in the mail.

## 2014-07-06 ENCOUNTER — Other Ambulatory Visit: Payer: Self-pay | Admitting: Gastroenterology

## 2014-07-06 DIAGNOSIS — K760 Fatty (change of) liver, not elsewhere classified: Secondary | ICD-10-CM

## 2014-07-06 NOTE — Progress Notes (Signed)
Quick Note:  Patient aware. Addressed at ov. ______

## 2014-07-06 NOTE — Progress Notes (Signed)
Lab orders on file. Fatty liver info mailed to the pt.

## 2014-09-11 ENCOUNTER — Other Ambulatory Visit: Payer: Self-pay

## 2014-09-11 DIAGNOSIS — K760 Fatty (change of) liver, not elsewhere classified: Secondary | ICD-10-CM

## 2014-12-22 ENCOUNTER — Other Ambulatory Visit: Payer: Self-pay | Admitting: Gastroenterology

## 2015-01-04 ENCOUNTER — Ambulatory Visit: Payer: Medicare Other | Admitting: Gastroenterology

## 2015-04-20 DIAGNOSIS — Z23 Encounter for immunization: Secondary | ICD-10-CM | POA: Diagnosis not present

## 2015-05-28 DIAGNOSIS — R05 Cough: Secondary | ICD-10-CM | POA: Diagnosis not present

## 2015-05-28 DIAGNOSIS — Z6831 Body mass index (BMI) 31.0-31.9, adult: Secondary | ICD-10-CM | POA: Diagnosis not present

## 2015-06-23 ENCOUNTER — Other Ambulatory Visit: Payer: Self-pay | Admitting: Gastroenterology

## 2015-10-26 ENCOUNTER — Other Ambulatory Visit: Payer: Self-pay | Admitting: Gastroenterology

## 2015-10-29 ENCOUNTER — Emergency Department (HOSPITAL_COMMUNITY): Payer: Medicare Other

## 2015-10-29 ENCOUNTER — Encounter (HOSPITAL_COMMUNITY): Payer: Self-pay | Admitting: *Deleted

## 2015-10-29 ENCOUNTER — Emergency Department (HOSPITAL_COMMUNITY)
Admission: EM | Admit: 2015-10-29 | Discharge: 2015-10-29 | Disposition: A | Discharge status: Home / Self Care | Payer: Medicare Other | Attending: Emergency Medicine | Admitting: Emergency Medicine

## 2015-10-29 DIAGNOSIS — Y929 Unspecified place or not applicable: Secondary | ICD-10-CM | POA: Insufficient documentation

## 2015-10-29 DIAGNOSIS — Y939 Activity, unspecified: Secondary | ICD-10-CM | POA: Diagnosis not present

## 2015-10-29 DIAGNOSIS — W19XXXA Unspecified fall, initial encounter: Secondary | ICD-10-CM | POA: Diagnosis not present

## 2015-10-29 DIAGNOSIS — R55 Syncope and collapse: Secondary | ICD-10-CM | POA: Diagnosis present

## 2015-10-29 DIAGNOSIS — R404 Transient alteration of awareness: Secondary | ICD-10-CM | POA: Diagnosis not present

## 2015-10-29 DIAGNOSIS — R51 Headache: Secondary | ICD-10-CM | POA: Diagnosis not present

## 2015-10-29 DIAGNOSIS — G4489 Other headache syndrome: Secondary | ICD-10-CM | POA: Diagnosis not present

## 2015-10-29 DIAGNOSIS — G44209 Tension-type headache, unspecified, not intractable: Secondary | ICD-10-CM | POA: Diagnosis not present

## 2015-10-29 DIAGNOSIS — R42 Dizziness and giddiness: Secondary | ICD-10-CM | POA: Diagnosis not present

## 2015-10-29 DIAGNOSIS — S199XXA Unspecified injury of neck, initial encounter: Secondary | ICD-10-CM | POA: Diagnosis not present

## 2015-10-29 DIAGNOSIS — Y999 Unspecified external cause status: Secondary | ICD-10-CM | POA: Diagnosis not present

## 2015-10-29 DIAGNOSIS — R110 Nausea: Secondary | ICD-10-CM | POA: Diagnosis not present

## 2015-10-29 DIAGNOSIS — S0990XA Unspecified injury of head, initial encounter: Secondary | ICD-10-CM | POA: Diagnosis not present

## 2015-10-29 DIAGNOSIS — Z87891 Personal history of nicotine dependence: Secondary | ICD-10-CM | POA: Diagnosis not present

## 2015-10-29 DIAGNOSIS — M542 Cervicalgia: Secondary | ICD-10-CM | POA: Diagnosis not present

## 2015-10-29 LAB — CBC WITH DIFFERENTIAL/PLATELET
BASOS ABS: 0 10*3/uL (ref 0.0–0.1)
BASOS PCT: 1 %
Eosinophils Absolute: 0.2 10*3/uL (ref 0.0–0.7)
Eosinophils Relative: 6 %
HEMATOCRIT: 39.2 % (ref 36.0–46.0)
Hemoglobin: 13.3 g/dL (ref 12.0–15.0)
Lymphocytes Relative: 34 %
Lymphs Abs: 1.3 10*3/uL (ref 0.7–4.0)
MCH: 35.6 pg — ABNORMAL HIGH (ref 26.0–34.0)
MCHC: 33.9 g/dL (ref 30.0–36.0)
MCV: 104.8 fL — ABNORMAL HIGH (ref 78.0–100.0)
Monocytes Absolute: 0.3 10*3/uL (ref 0.1–1.0)
Monocytes Relative: 7 %
NEUTROS ABS: 2.1 10*3/uL (ref 1.7–7.7)
NEUTROS PCT: 53 %
Platelets: 98 10*3/uL — ABNORMAL LOW (ref 150–400)
RBC: 3.74 MIL/uL — AB (ref 3.87–5.11)
RDW: 13.1 % (ref 11.5–15.5)
WBC: 4 10*3/uL (ref 4.0–10.5)

## 2015-10-29 LAB — COMPREHENSIVE METABOLIC PANEL
ALBUMIN: 3.5 g/dL (ref 3.5–5.0)
ALT: 26 U/L (ref 14–54)
ANION GAP: 7 (ref 5–15)
AST: 26 U/L (ref 15–41)
Alkaline Phosphatase: 66 U/L (ref 38–126)
BILIRUBIN TOTAL: 0.8 mg/dL (ref 0.3–1.2)
BUN: 13 mg/dL (ref 6–20)
CO2: 23 mmol/L (ref 22–32)
Calcium: 7.9 mg/dL — ABNORMAL LOW (ref 8.9–10.3)
Chloride: 111 mmol/L (ref 101–111)
Creatinine, Ser: 0.92 mg/dL (ref 0.44–1.00)
GFR calc non Af Amer: >60 mL/min (ref >60)
GLUCOSE: 147 mg/dL — AB (ref 65–99)
POTASSIUM: 3.9 mmol/L (ref 3.5–5.1)
Sodium: 141 mmol/L (ref 135–145)
TOTAL PROTEIN: 5.7 g/dL — AB (ref 6.5–8.1)

## 2015-10-29 LAB — TROPONIN I

## 2015-10-29 LAB — ETHANOL: ALCOHOL ETHYL (B): 65 mg/dL — AB (ref <5)

## 2015-10-29 MED ORDER — METOCLOPRAMIDE HCL 5 MG/ML IJ SOLN
5.0000 mg | Freq: Once | INTRAMUSCULAR | Status: AC
  Administered 2015-10-29: 5 mg via INTRAVENOUS
  Filled 2015-10-29: qty 2

## 2015-10-29 MED ORDER — DEXAMETHASONE SODIUM PHOSPHATE 10 MG/ML IJ SOLN
10.0000 mg | Freq: Once | INTRAMUSCULAR | Status: AC
  Administered 2015-10-29: 10 mg via INTRAVENOUS
  Filled 2015-10-29: qty 1

## 2015-10-29 MED ORDER — DIPHENHYDRAMINE HCL 50 MG/ML IJ SOLN
12.5000 mg | Freq: Once | INTRAMUSCULAR | Status: AC
  Administered 2015-10-29: 12.5 mg via INTRAVENOUS
  Filled 2015-10-29: qty 1

## 2015-10-29 MED ORDER — SODIUM CHLORIDE 0.9 % IV BOLUS (SEPSIS): 1000.0000 mL | Freq: Once | INTRAVENOUS | Status: DC

## 2015-10-29 NOTE — ED Notes (Signed)
[facility] reports cbg of  149

## 2015-10-29 NOTE — ED Provider Notes (Signed)
CSN: [id number]     Arrival date & time 10/29/15  0210 History   First MD Initiated Contact with Patient 10/29/15 0341  AM   Chief Complaint  Patient presents with  . Near Syncope     (Consider location/radiation/quality/duration/timing/severity/associated sxs/prior Treatment) HPI patient reports she has had a headache for the past 3-4 days. She states the back of her head "hurts". She describes it as a dull constant ache that does not go away. She states it gets worse with stress and is relieved minimally by Aleve. She denies nausea, vomiting, any visual changes, numbness or tingling of her extremities. She states sometimes the headache extends down towards her left shoulder and her neck hurts when she looks from side to side. She denies any fever. She states she's had a normal appetite. She states she hasn't been feeling well because they have some stress going on in their life right now. They went to bed tonight and husband states he was awakened when patient fell. He states she seemed confused and didn't know what happened and did not remember getting out of bed. She was unable to get up and EMS was called. He states at that time she was complaining of her head hurting a lot and she was dizzy and she had some nausea. He states she had vertigo many years ago. She denies any history of bad headaches. She denies chest pain or shortness of breath. She states she was started on Protonix recently because she has a hiatal hernia. She denies any other changes in her medications. She states she had 4 glasses of wine tonight.   PCP Dr Willey Blade GI Dr Gala Romney  Past Medical History  Diagnosis Date  . Macular degeneration   . Anxiety    Past Surgical History  Procedure Laterality Date  . Cholecystectomy  2011    Dr. Geroge Baseman  . Cyst removed from right hand    . Colonoscopy N/A 04/05/2014    [id number] diverticulosis,colonic polyps removed (TA), random colon biopsies negative. next TCS 03/2021  .  Esophagogastroduodenoscopy N/A 04/05/2014    [id number] schazkis ring/HH, abnormal gastric mucosa with gastric polyps (benign)   Family History  Problem Relation Age of Onset  . Colon cancer Neg Hx   . Pancreatic cancer Mother   . Diabetes    . Heart disease     Social History  Substance Use Topics  . Smoking status: Former Smoker -- 1.50 packs/day for 20 years    Types: Cigarettes  . Smokeless tobacco: None     Comment: Quit smokking 3 yrs ago  . Alcohol Use: 8.4 oz/week    0 Standard drinks or equivalent, 14 Glasses of wine per week     Comment: 2 glasses of wine daily   Lives at home Lives with spouse  OB History    No data available     Review of Systems  All other systems reviewed and are negative.     Allergies  Codeine  Home Medications   Prior to Admission medications   Medication Sig Start Date End Date Taking? Authorizing Provider  Calcium Carbonate-Vitamin D (CALCIUM + D PO) Take by mouth daily.    Historical Provider, MD  dexlansoprazole (DEXILANT) 60 MG capsule Take 60 mg by mouth daily.    Historical Provider, MD  fluticasone (FLONASE) 50 MCG/ACT nasal spray Place 1 spray into both nostrils daily.    Historical Provider, MD  Garlic (GARLIQUE PO) Take 1 capsule by mouth daily.  Historical Provider, MD  Glucosamine-Chondroitin-Ca-D3 (TRIPLE FLEX 50+ PO) Take 1 tablet by mouth daily.     Historical Provider, MD  MELATONIN PO Take 1 tablet by mouth at bedtime.     Historical Provider, MD  Multiple Vitamins-Minerals (PRESERVISION AREDS 2 PO) Take 1 capsule by mouth daily.     Historical Provider, MD  Omega-3 Fatty Acids (RA FISH OIL) 900 MG CAPS Take 1 capsule by mouth daily.     Historical Provider, MD  pantoprazole (PROTONIX) 40 MG tablet TAKE 1 TABLET(40 MG) BY MOUTH TWICE DAILY BEFORE A MEAL 10/26/15   Orvil Feil, NP  POTASSIUM CHLORIDE PO Take 1 tablet by mouth daily.     Historical Provider, MD   BP 136/91 mmHg  Pulse 100  Temp(Src) 97.6 F  (36.4 C) (Oral)  Resp 15  Ht 5\' 5"  (1.651 m)  Wt 178 lb (80.74 kg)  BMI 29.62 kg/m2  SpO2 92%  Vital signs normal except borderline tachycardia  Physical Exam  Constitutional: She is oriented to person, place, and time. She appears well-developed and well-nourished.  Non-toxic appearance. She does not appear ill. No distress.  HENT:  Head: Normocephalic and atraumatic.  Right Ear: External ear normal.  Left Ear: External ear normal.  Nose: Nose normal. No mucosal edema or rhinorrhea.  Mouth/Throat: Mucous membranes are normal. No dental abscesses or uvula swelling.  Dry tongue  Eyes: Conjunctivae and EOM are normal. Pupils are equal, round, and reactive to light.  Neck: Normal range of motion and full passive range of motion without pain. Neck supple.  Cardiovascular: Normal rate, regular rhythm and normal heart sounds.  Exam reveals no gallop and no friction rub.   No murmur heard. Pulmonary/Chest: Effort normal and breath sounds normal. No respiratory distress. She has no wheezes. She has no rhonchi. She has no rales. She exhibits no tenderness and no crepitus.  Abdominal: Soft. Normal appearance and bowel sounds are normal. She exhibits no distension. There is no tenderness. There is no rebound and no guarding.  Musculoskeletal: Normal range of motion. She exhibits no edema or tenderness.  Moves all extremities well.   Neurological: She is alert and oriented to person, place, and time. She has normal strength. No cranial nerve deficit.  Skin: Skin is warm, dry and intact. No rash noted. No erythema. No pallor.  Psychiatric: She has a normal mood and affect. Her speech is normal and behavior is normal. Her mood appears not anxious.  Nursing note and vitals reviewed.   ED Course  Procedures (including critical care time)  Medications  sodium chloride 0.9 % bolus 1,000 mL (1,000 mLs Intravenous Not Given 10/29/15 0423)  metoCLOPramide (REGLAN) injection 5 mg (5 mg Intravenous  Given 10/29/15 0417)  diphenhydrAMINE (BENADRYL) injection 12.5 mg (12.5 mg Intravenous Given 10/29/15 0416)  metoCLOPramide (REGLAN) injection 5 mg (5 mg Intravenous Given 10/29/15 0703)  diphenhydrAMINE (BENADRYL) injection 12.5 mg (12.5 mg Intravenous Given 10/29/15 0703)  dexamethasone (DECADRON) injection 10 mg (10 mg Intravenous Given 10/29/15 0703)   Patient was given IV fluids and half the usual migraine cocktail dose due to her age.  Recheck at 6 AM patient is getting ready to go for her CT scan. She states her headache is improved but not gone. She was advised her laboratory tests looked normal.  When patient returned from CT scan she was given the other half of her migraine cocktail plus Decadron. I have discussed her CT results. She states her headache is improved. She feels  ready to be discharged.  Labs Review Results for orders placed or performed during the hospital encounter of 10/29/15  Comprehensive metabolic panel  Result Value Ref Range   Sodium 141 135 - 145 mmol/L   Potassium 3.9 3.5 - 5.1 mmol/L   Chloride 111 101 - 111 mmol/L   CO2 23 22 - 32 mmol/L   Glucose, Bld 147 (H) 65 - 99 mg/dL   BUN 13 6 - 20 mg/dL   Creatinine, Ser 0.92 0.44 - 1.00 mg/dL   Calcium 7.9 (L) 8.9 - 10.3 mg/dL   Total Protein 5.7 (L) 6.5 - 8.1 g/dL   Albumin 3.5 3.5 - 5.0 g/dL   AST 26 15 - 41 U/L   ALT 26 14 - 54 U/L   Alkaline Phosphatase 66 38 - 126 U/L   Total Bilirubin 0.8 0.3 - 1.2 mg/dL   GFR calc non Af Amer >60 >60 mL/min   GFR calc Af Amer >60 >60 mL/min   Anion gap 7 5 - 15  Troponin I  Result Value Ref Range   Troponin I <0.03 <0.03 ng/mL  CBC with Differential  Result Value Ref Range   WBC 4.0 4.0 - 10.5 K/uL   RBC 3.74 (L) 3.87 - 5.11 MIL/uL   Hemoglobin 13.3 12.0 - 15.0 g/dL   HCT 39.2 36.0 - 46.0 %   MCV 104.8 (H) 78.0 - 100.0 fL   MCH 35.6 (H) 26.0 - 34.0 pg   MCHC 33.9 30.0 - 36.0 g/dL   RDW 13.1 11.5 - 15.5 %   Platelets 98 (L) 150 - 400 K/uL   Neutrophils  Relative % 53 %   Neutro Abs 2.1 1.7 - 7.7 K/uL   Lymphocytes Relative 34 %   Lymphs Abs 1.3 0.7 - 4.0 K/uL   Monocytes Relative 7 %   Monocytes Absolute 0.3 0.1 - 1.0 K/uL   Eosinophils Relative 6 %   Eosinophils Absolute 0.2 0.0 - 0.7 K/uL   Basophils Relative 1 %   Basophils Absolute 0.0 0.0 - 0.1 K/uL  Ethanol  Result Value Ref Range   Alcohol, Ethyl (B) 65 (H) <5 mg/dL    Laboratory interpretation all normal except + ETOH, Elevated MCV consistent with 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 Headache and posterior neck pain. EXAM: CT HEAD WITHOUT CONTRAST CT CERVICAL SPINE WITHOUT CONTRAST TECHNIQUE: Multidetector CT imaging of the head and cervical spine was performed following the standard protocol without intravenous contrast. Multiplanar CT image reconstructions of the cervical spine were also generated. COMPARISON:  None. FINDINGS: CT HEAD FINDINGS Ventricles and sulci appear symmetrical. No ventricular dilatation. Low-attenuation changes in the deep white matter consistent with small vessel ischemia. No mass effect or midline shift. No abnormal extra-axial fluid collections. Gray-white matter junctions are distinct. Basal cisterns are not effaced. No evidence of acute intracranial hemorrhage. No depressed skull fractures. Mucosal thickening in the paranasal sinuses. Retention cysts in the floor of the right maxillary antrum. Mastoid air cells are not opacified. CT CERVICAL SPINE FINDINGS Degenerative changes in the cervical spine with narrowed cervical interspaces and associated endplate hypertrophic changes. Slight anterior subluxation of C4 on C5 is likely degenerative. Degenerative changes in the facet joints. No vertebral compression deformities. No prevertebral soft tissue swelling. C1-2 articulation appears intact. Soft tissues are  unremarkable. IMPRESSION: No acute intracranial abnormalities.  Chronic small vessel ischemic changes. Degenerative changes in the cervical spine. No acute displaced fractures identified. Electronically Signed   By: Lucienne Capers M.D.   On: 10/29/2015 06:32   I have personally reviewed and evaluated these images and lab results as part of my medical decision-making.   EKG Interpretation   Date/Time:  Monday October 29 2015 02:19:25 EDT Ventricular Rate:  86 PR Interval:    QRS Duration: 95 QT Interval:  388 QTC Calculation: 465 R Axis:   -49 Text Interpretation:  Sinus rhythm Inferior infarct, old No significant  change since last tracing 20 Jun 2008 Confirmed by [facility]  MD-I, Teshia Mahone  ([id number]) on 10/29/2015 3:41:42 AM      MDM   Final diagnoses:  Tension headache  Syncope, unspecified syncope type    Plan discharge  Rolland Porter, MD, Barbette Or, MD 10/29/15 [phone]

## 2015-10-29 NOTE — ED Notes (Signed)
Pt brought in by [facility] for c/o syncopal episode; husband reported to ems that he heard a thump and found pt in floor; pt states she doesn't feel good; pt does not remember what happened

## 2015-10-29 NOTE — Discharge Instructions (Signed)
Go home and rest. Take ibuprofen 600 mg + acetaminophen 650 mg every 6 hrs as needed for headache. Return to the ED if your headache gets worse or you have any problems listed on the head injury sheet. Let Dr Ria Comment office know about your ED visit tonight, he may want to recheck you again this week.    General Headache Without Cause A headache is pain or discomfort felt around the head or neck area. The specific cause of a headache may not be found. There are many causes and types of headaches. A few common ones are:  Tension headaches.  Migraine headaches.  Cluster headaches.  Chronic daily headaches. HOME CARE INSTRUCTIONS  Watch your condition for any changes. Take these steps to help with your condition: Managing Pain  Take over-the-counter and prescription medicines only as told by your health care provider.  Lie down in a dark, quiet room when you have a headache.  If directed, apply ice to the head and neck area:  Put ice in a plastic bag.  Place a towel between your skin and the bag.  Leave the ice on for 20 minutes, 2-3 times per day.  Use a heating pad or hot shower to apply heat to the head and neck area as told by your health care provider.  Keep lights dim if bright lights bother you or make your headaches worse. Eating and Drinking  Eat meals on a regular schedule.  Limit alcohol use.  Decrease the amount of caffeine you drink, or stop drinking caffeine. General Instructions  Keep all follow-up visits as told by your health care provider. This is important.  Keep a headache journal to help find out what may trigger your headaches. For example, write down:  What you eat and drink.  How much sleep you get.  Any change to your diet or medicines.  Try massage or other relaxation techniques.  Limit stress.  Sit up straight, and do not tense your muscles.  Do not use tobacco products, including cigarettes, chewing tobacco, or e-cigarettes. If you need  help quitting, ask your health care provider.  Exercise regularly as told by your health care provider.  Sleep on a regular schedule. Get 7-9 hours of sleep, or the amount recommended by your health care provider. SEEK MEDICAL CARE IF:   Your symptoms are not helped by medicine.  You have a headache that is different from the usual headache.  You have nausea or you vomit.  You have a fever. SEEK IMMEDIATE MEDICAL CARE IF:   Your headache becomes severe.  You have repeated vomiting.  You have a stiff neck.  You have a loss of vision.  You have problems with speech.  You have pain in the eye or ear.  You have muscular weakness or loss of muscle control.  You lose your balance or have trouble walking.  You feel faint or pass out.  You have confusion.   This information is not intended to replace advice given to you by your health care provider. Make sure you discuss any questions you have with your health care provider.   Document Released: 04/07/2005 Document Revised: 12/27/2014 Document Reviewed: 07/31/2014 Elsevier Interactive Patient Education 2016 [employer].  Syncope Syncope is a medical term for fainting or passing out. This means you lose consciousness and drop to the ground. People are generally unconscious for less than 5 minutes. You may have some muscle twitches for up to 15 seconds before waking up and returning  to normal. Syncope occurs more often in older adults, but it can happen to anyone. While most causes of syncope are not dangerous, syncope can be a sign of a serious medical problem. It is important to seek medical care.  CAUSES  Syncope is caused by a sudden drop in blood flow to the brain. The specific cause is often not determined. Factors that can bring on syncope include:  Taking medicines that lower blood pressure.  Sudden changes in posture, such as standing up quickly.  Taking more medicine than prescribed.  Standing in one place for  too long.  Seizure disorders.  Dehydration and excessive exposure to heat.  Low blood sugar (hypoglycemia).  Straining to have a bowel movement.  Heart disease, irregular heartbeat, or other circulatory problems.  Fear, emotional distress, seeing blood, or severe pain. SYMPTOMS  Right before fainting, you may:  Feel dizzy or light-headed.  Feel nauseous.  See all white or all black in your field of vision.  Have cold, clammy skin. DIAGNOSIS  Your health care provider will ask about your symptoms, perform a physical exam, and perform an electrocardiogram (ECG) to record the electrical activity of your heart. Your health care provider may also perform other heart or blood tests to determine the cause of your syncope which may include:  Transthoracic echocardiogram (TTE). During echocardiography, sound waves are used to evaluate how blood flows through your heart.  Transesophageal echocardiogram (TEE).  Cardiac monitoring. This allows your health care provider to monitor your heart rate and rhythm in real time.  Holter monitor. This is a portable device that records your heartbeat and can help diagnose heart arrhythmias. It allows your health care provider to track your heart activity for several days, if needed.  Stress tests by exercise or by giving medicine that makes the heart beat faster. TREATMENT  In most cases, no treatment is needed. Depending on the cause of your syncope, your health care provider may recommend changing or stopping some of your medicines. HOME CARE INSTRUCTIONS  Have someone stay with you until you feel stable.  Do not drive, use machinery, or play sports until your health care provider says it is okay.  Keep all follow-up appointments as directed by your health care provider.  Lie down right away if you start feeling like you might faint. Breathe deeply and steadily. Wait until all the symptoms have passed.  Drink enough fluids to keep your  urine clear or pale yellow.  If you are taking blood pressure or heart medicine, get up slowly and take several minutes to sit and then stand. This can reduce dizziness. SEEK IMMEDIATE MEDICAL CARE IF:   You have a severe headache.  You have unusual pain in the chest, abdomen, or back.  You are bleeding from your mouth or rectum, or you have black or tarry stool.  You have an irregular or very fast heartbeat.  You have pain with breathing.  You have repeated fainting or seizure-like jerking during an episode.  You faint when sitting or lying down.  You have confusion.  You have trouble walking.  You have severe weakness.  You have vision problems. If you fainted, call your local emergency services (911 in [place] Do not drive yourself to the hospital.    This information is not intended to replace advice given to you by your health care provider. Make sure you discuss any questions you have with your health care provider.   Document Released: 04/07/2005 Document Revised: 08/22/2014 Document  Reviewed: 06/06/2011 Elsevier Interactive Patient Education 2016 Elsevier Inc.  Tension Headache A tension headache is a feeling of pain, pressure, or aching that is often felt over the front and sides of the head. The pain can be dull, or it can feel tight (constricting). Tension headaches are not normally associated with nausea or vomiting, and they do not get worse with physical activity. Tension headaches can last from 30 minutes to several days. This is the most common type of headache. CAUSES The exact cause of this condition is not known. Tension headaches often begin after stress, anxiety, or depression. Other triggers may include:  Alcohol.  Too much caffeine, or caffeine withdrawal.  Respiratory infections, such as colds, flu, or sinus infections.  Dental problems or teeth clenching.  Fatigue.  Holding your head and neck in the same position for a long period of time, such  as while using a computer.  Smoking. SYMPTOMS Symptoms of this condition include:  A feeling of pressure around the head.  Dull, aching head pain.  Pain felt over the front and sides of the head.  Tenderness in the muscles of the head, neck, and shoulders. DIAGNOSIS This condition may be diagnosed based on your symptoms and a physical exam. Tests may be done, such as a CT scan or an MRI of your head. These tests may be done if your symptoms are severe or unusual. TREATMENT This condition may be treated with lifestyle changes and medicines to help relieve symptoms. HOME CARE INSTRUCTIONS Managing Pain  Take over-the-counter and prescription medicines only as told by your health care provider.  Lie down in a dark, quiet room when you have a headache.  If directed, apply ice to the head and neck area:  Put ice in a plastic bag.  Place a towel between your skin and the bag.  Leave the ice on for 20 minutes, 2-3 times per day.  Use a heating pad or a hot shower to apply heat to the head and neck area as told by your health care provider. Eating and Drinking  Eat meals on a regular schedule.  Limit alcohol use.  Decrease your caffeine intake, or stop using caffeine. General Instructions  Keep all follow-up visits as told by your health care provider. This is important.  Keep a headache journal to help find out what may trigger your headaches. For example, write down:  What you eat and drink.  How much sleep you get.  Any change to your diet or medicines.  Try massage or other relaxation techniques.  Limit stress.  Sit up straight, and avoid tensing your muscles.  Do not use tobacco products, including cigarettes, chewing tobacco, or e-cigarettes. If you need help quitting, ask your health care provider.  Exercise regularly as told by your health care provider.  Get 7-9 hours of sleep, or the amount recommended by your health care provider. SEEK MEDICAL CARE  IF:  Your symptoms are not helped by medicine.  You have a headache that is different from what you normally experience.  You have nausea or you vomit.  You have a fever. SEEK IMMEDIATE MEDICAL CARE IF:  Your headache becomes severe.  You have repeated vomiting.  You have a stiff neck.  You have a loss of vision.  You have problems with speech.  You have pain in your eye or ear.  You have muscular weakness or loss of muscle control.  You lose your balance or you have trouble walking.  You feel  faint or you pass out.  You have confusion.   This information is not intended to replace advice given to you by your health care provider. Make sure you discuss any questions you have with your health care provider.   Document Released: 04/07/2005 Document Revised: 12/27/2014 Document Reviewed: 07/31/2014 Elsevier Interactive Patient Education [employer].

## 2016-01-15 DIAGNOSIS — N3000 Acute cystitis without hematuria: Secondary | ICD-10-CM | POA: Diagnosis not present

## 2016-01-15 DIAGNOSIS — N390 Urinary tract infection, site not specified: Secondary | ICD-10-CM | POA: Diagnosis not present

## 2016-02-18 DIAGNOSIS — Z23 Encounter for immunization: Secondary | ICD-10-CM | POA: Diagnosis not present

## 2016-04-11 ENCOUNTER — Other Ambulatory Visit: Payer: Self-pay | Admitting: Gastroenterology

## 2016-10-11 ENCOUNTER — Other Ambulatory Visit: Payer: Self-pay | Admitting: Gastroenterology

## 2017-02-20 ENCOUNTER — Other Ambulatory Visit: Payer: Self-pay | Admitting: Gastroenterology

## 2017-05-26 DIAGNOSIS — Z23 Encounter for immunization: Secondary | ICD-10-CM | POA: Diagnosis not present

## 2017-07-16 ENCOUNTER — Other Ambulatory Visit: Payer: Self-pay | Admitting: Gastroenterology

## 2017-08-14 DIAGNOSIS — H6122 Impacted cerumen, left ear: Secondary | ICD-10-CM | POA: Diagnosis not present

## 2017-08-14 DIAGNOSIS — Z6830 Body mass index (BMI) 30.0-30.9, adult: Secondary | ICD-10-CM | POA: Diagnosis not present

## 2017-08-14 DIAGNOSIS — Z23 Encounter for immunization: Secondary | ICD-10-CM | POA: Diagnosis not present

## 2017-11-04 IMAGING — CT CT HEAD W/O CM
4 of 8 series · 16 of 47 positions shown, 18 images · non-contrast
Comparison: None.

CLINICAL DATA: Syncopal episode followup by fall. Headache and
posterior neck pain.

EXAM:
CT HEAD WITHOUT CONTRAST
CT CERVICAL SPINE WITHOUT CONTRAST
TECHNIQUE: Multidetector CT imaging of the head and cervical spine was
performed following the standard protocol without intravenous
contrast. Multiplanar CT image reconstructions of the cervical spine
were also generated.

[Series 3: head bone · axial · 0.43mm/px · z∈[+8,+52]mm · 3 of 80 slices shown]
[im 12/80  bone]
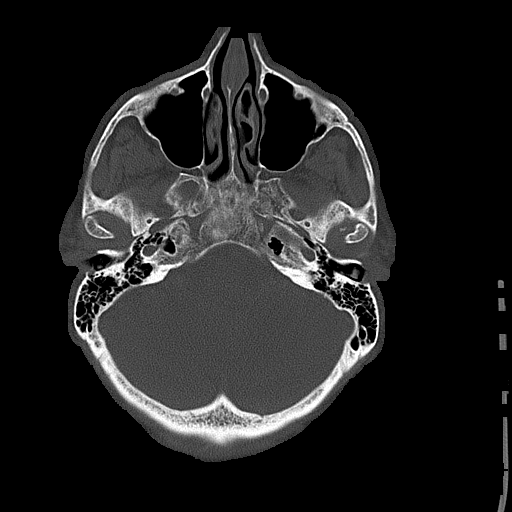
[im 23/80  bone]
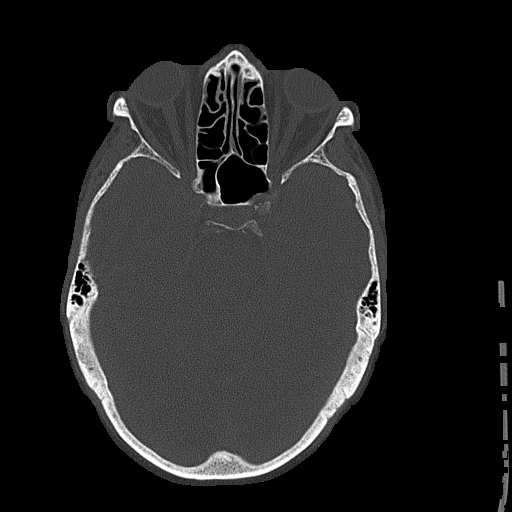
[im 34/80  bone]
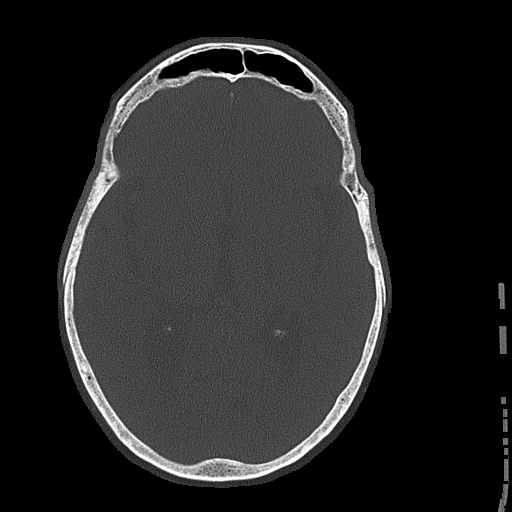

[Series 4: coronal · coronal · 0.30mm/px · 3 of 67 slices shown]
[im 19/67  brain]
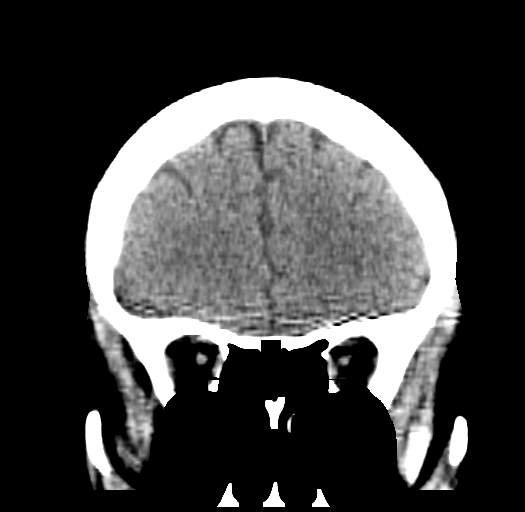
[im 29/67  brain]
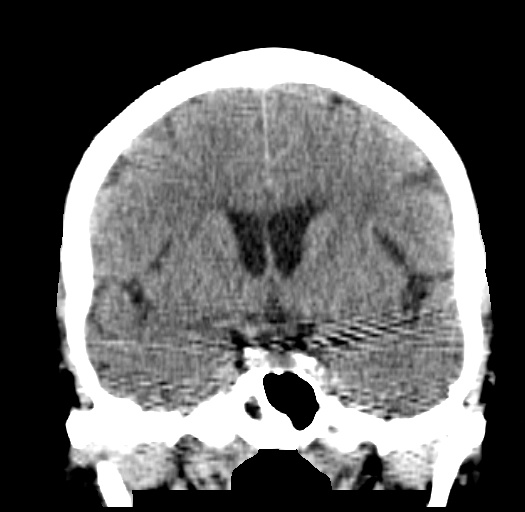
[im 38/67  brain]
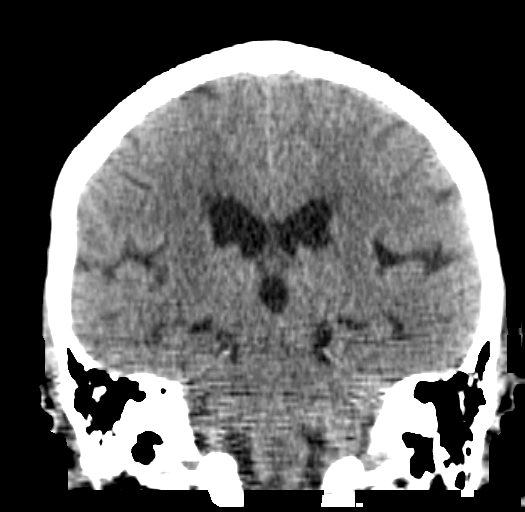

[Series 5: sagittal · sagittal · 0.30mm/px · 2 of 56 slices shown]
[im 19/56  brain]
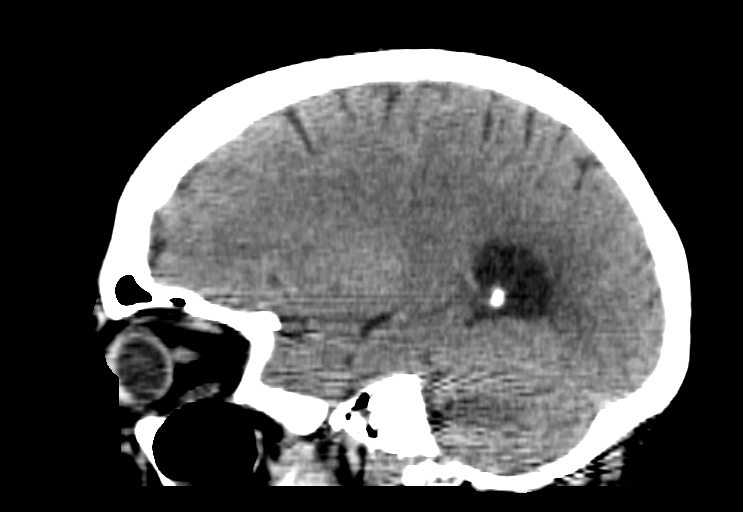
[im 37/56  brain]
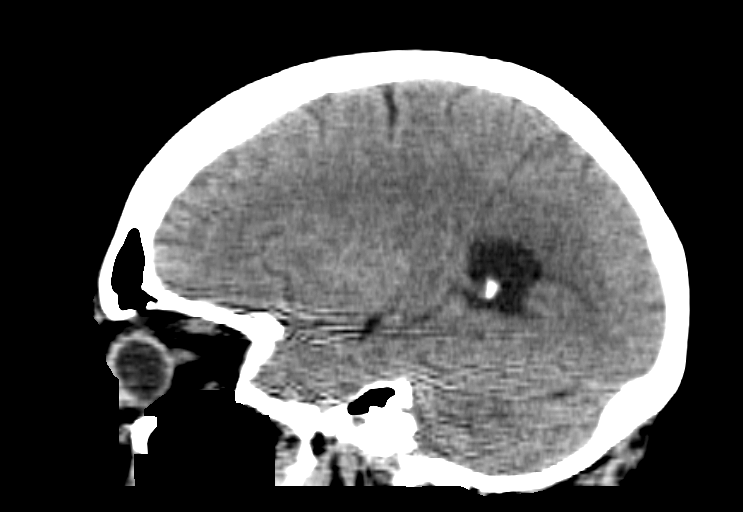

[Series 10: orthogonal axial · axial · 0.23mm/px · z∈[-163,-5]mm · 8 of 105 slices shown, 10 images]
[im 12/105  brain]
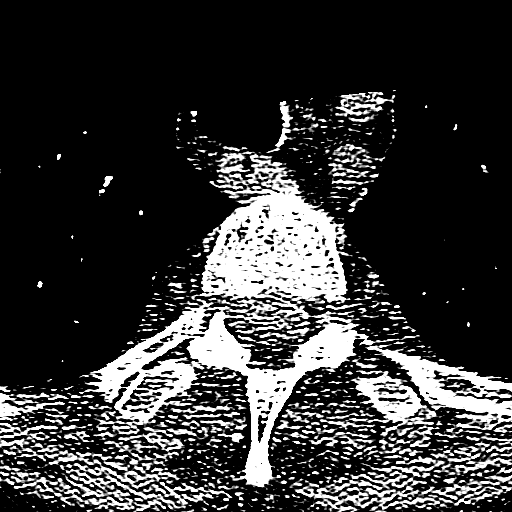
[im 12/105  bone]
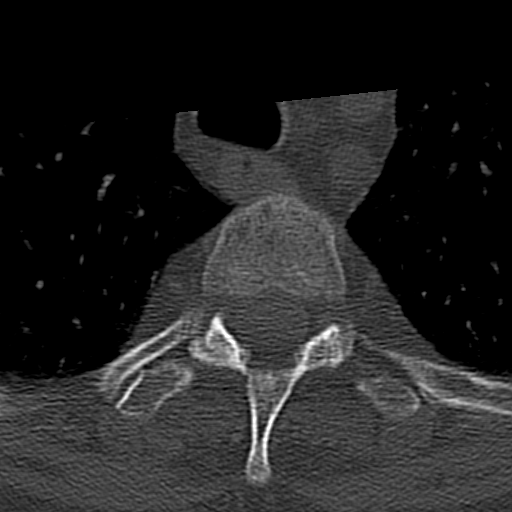
[im 24/105  brain]
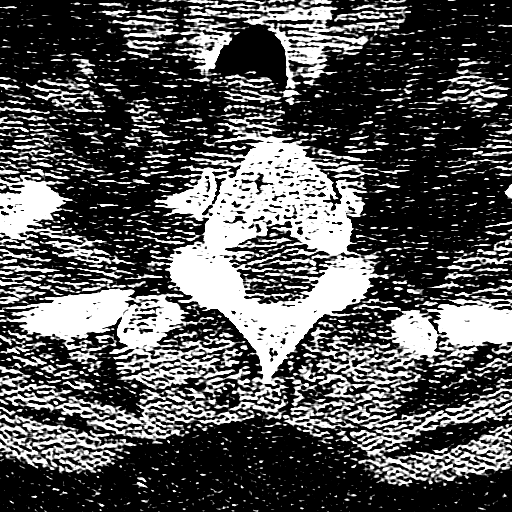
[im 35/105  brain]
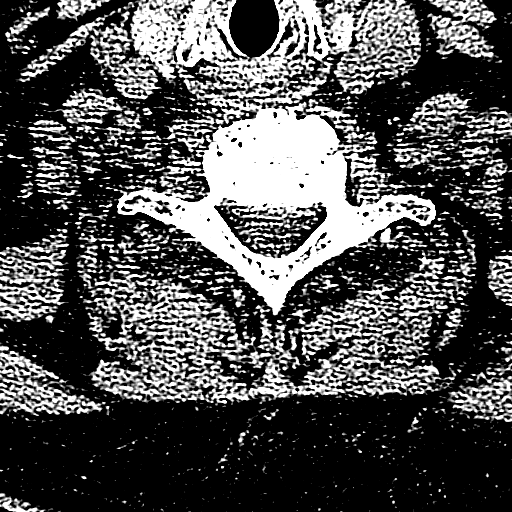
[im 47/105  brain]
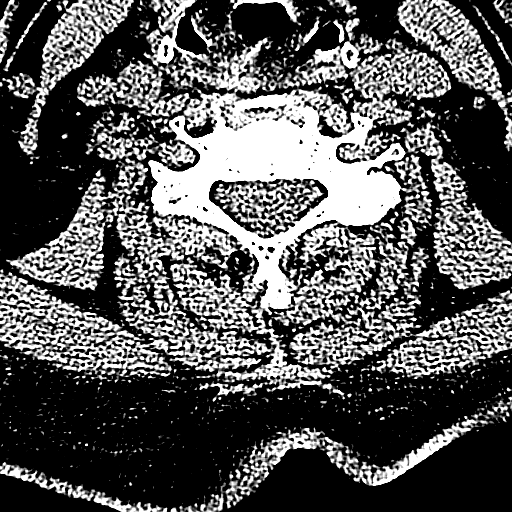
[im 58/105  brain]
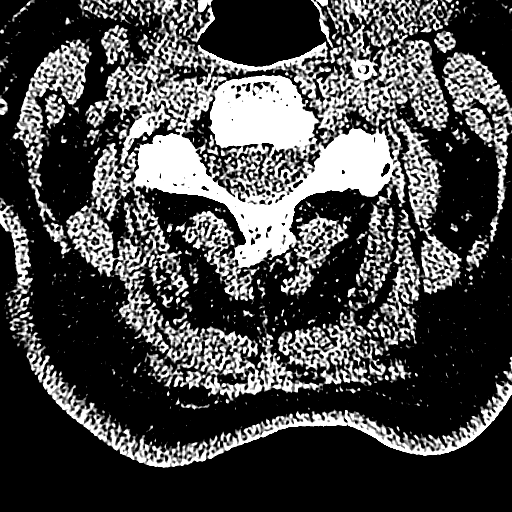
[im 58/105  bone]
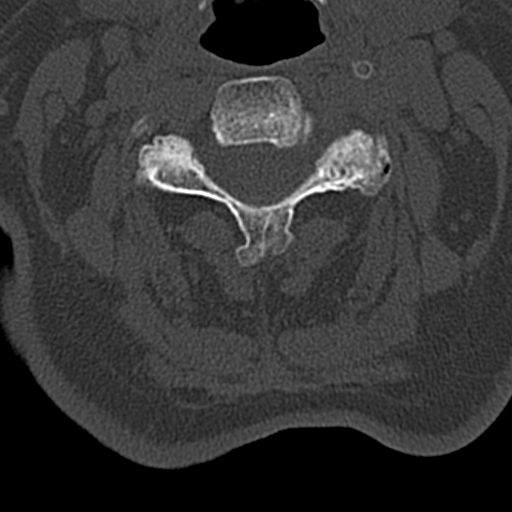
[im 70/105  brain]
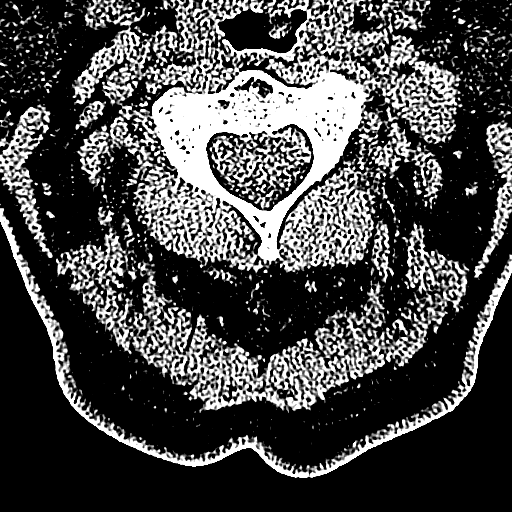
[im 81/105  brain]
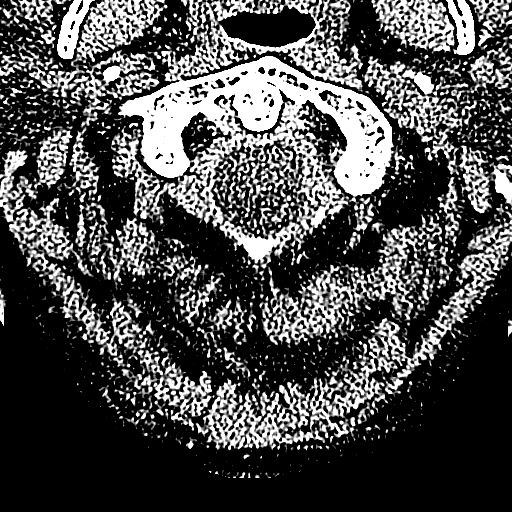
[im 93/105  brain]
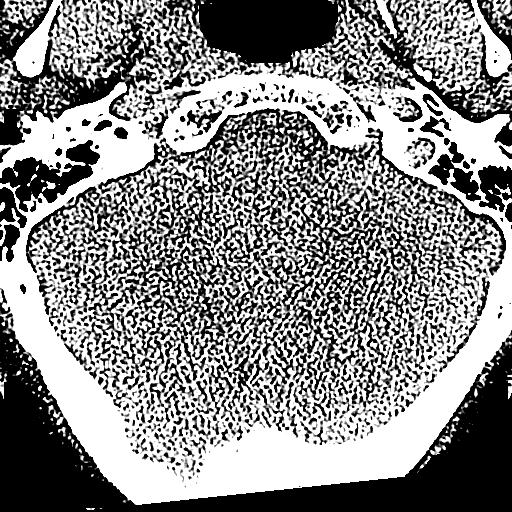

[16 of 47 positions shown; findings below may reference images not displayed]

FINDINGS: CT HEAD FINDINGS

Ventricles and sulci appear symmetrical. No ventricular dilatation.
Low-attenuation changes in the deep white matter consistent with
small vessel ischemia. No mass effect or midline shift. No abnormal
extra-axial fluid collections. Gray-white matter junctions are
distinct. Basal cisterns are not effaced. No evidence of acute
intracranial hemorrhage. No depressed skull fractures. Mucosal
thickening in the paranasal sinuses. Retention cysts in the floor of
the right maxillary antrum. Mastoid air cells are not opacified.

CT CERVICAL SPINE FINDINGS

Degenerative changes in the cervical spine with narrowed cervical
interspaces and associated endplate hypertrophic changes. Slight
anterior subluxation of C4 on C5 is likely degenerative.
Degenerative changes in the facet joints. No vertebral compression
deformities. No prevertebral soft tissue swelling. C1-2 articulation
appears intact. Soft tissues are unremarkable.
IMPRESSION: No acute intracranial abnormalities. Chronic small vessel ischemic
changes.

Degenerative changes in the cervical spine. No acute displaced
fractures identified.

## 2017-11-11 ENCOUNTER — Other Ambulatory Visit: Payer: Self-pay | Admitting: Gastroenterology

## 2017-11-11 ENCOUNTER — Other Ambulatory Visit: Payer: Self-pay | Admitting: Nurse Practitioner

## 2018-03-25 ENCOUNTER — Other Ambulatory Visit: Payer: Self-pay

## 2018-03-26 MED ORDER — PANTOPRAZOLE SODIUM 40 MG PO TBEC: DELAYED_RELEASE_TABLET | ORAL | 3 refills | Status: DC

## 2018-05-05 DIAGNOSIS — H6123 Impacted cerumen, bilateral: Secondary | ICD-10-CM | POA: Diagnosis not present

## 2018-05-05 DIAGNOSIS — Z6829 Body mass index (BMI) 29.0-29.9, adult: Secondary | ICD-10-CM | POA: Diagnosis not present

## 2018-05-17 ENCOUNTER — Ambulatory Visit (INDEPENDENT_AMBULATORY_CARE_PROVIDER_SITE_OTHER): Payer: Medicare Other | Admitting: Otolaryngology

## 2018-05-17 DIAGNOSIS — H903 Sensorineural hearing loss, bilateral: Secondary | ICD-10-CM | POA: Diagnosis not present

## 2018-05-17 DIAGNOSIS — H9313 Tinnitus, bilateral: Secondary | ICD-10-CM | POA: Diagnosis not present

## 2018-05-17 DIAGNOSIS — H6983 Other specified disorders of Eustachian tube, bilateral: Secondary | ICD-10-CM | POA: Diagnosis not present

## 2018-05-17 DIAGNOSIS — H6123 Impacted cerumen, bilateral: Secondary | ICD-10-CM | POA: Diagnosis not present

## 2018-06-28 ENCOUNTER — Ambulatory Visit (INDEPENDENT_AMBULATORY_CARE_PROVIDER_SITE_OTHER): Payer: Medicare Other | Admitting: Otolaryngology

## 2018-06-28 DIAGNOSIS — H6983 Other specified disorders of Eustachian tube, bilateral: Secondary | ICD-10-CM

## 2018-06-30 DIAGNOSIS — Z23 Encounter for immunization: Secondary | ICD-10-CM | POA: Diagnosis not present

## 2018-11-30 DIAGNOSIS — S91103A Unspecified open wound of unspecified great toe without damage to nail, initial encounter: Secondary | ICD-10-CM | POA: Diagnosis not present

## 2018-11-30 DIAGNOSIS — F419 Anxiety disorder, unspecified: Secondary | ICD-10-CM | POA: Diagnosis not present

## 2018-12-01 ENCOUNTER — Other Ambulatory Visit: Payer: Self-pay | Admitting: Podiatry

## 2018-12-01 ENCOUNTER — Ambulatory Visit (INDEPENDENT_AMBULATORY_CARE_PROVIDER_SITE_OTHER): Payer: Medicare Other | Admitting: Podiatry

## 2018-12-01 ENCOUNTER — Other Ambulatory Visit: Payer: Self-pay

## 2018-12-01 ENCOUNTER — Encounter: Payer: Self-pay | Admitting: Podiatry

## 2018-12-01 ENCOUNTER — Ambulatory Visit (INDEPENDENT_AMBULATORY_CARE_PROVIDER_SITE_OTHER): Payer: Medicare Other

## 2018-12-01 VITALS — BP 136/81 | HR 87

## 2018-12-01 DIAGNOSIS — L97522 Non-pressure chronic ulcer of other part of left foot with fat layer exposed: Secondary | ICD-10-CM | POA: Diagnosis not present

## 2018-12-01 DIAGNOSIS — L989 Disorder of the skin and subcutaneous tissue, unspecified: Secondary | ICD-10-CM | POA: Diagnosis not present

## 2018-12-01 DIAGNOSIS — M79671 Pain in right foot: Secondary | ICD-10-CM

## 2018-12-01 DIAGNOSIS — M79672 Pain in left foot: Secondary | ICD-10-CM

## 2018-12-01 MED ORDER — GENTAMICIN SULFATE 0.1 % EX CREA: 1.0000 "application " | TOPICAL_CREAM | Freq: Two times a day (BID) | CUTANEOUS | 1 refills | Status: DC

## 2018-12-06 NOTE — Progress Notes (Signed)
   Subjective: 73 y.o. female presenting to the office today as a new patient with a chief complaint of painful lesions noted to bilateral great toes that appeared about three weeks ago. She states the lesions started out like blisters but then the surrounding skin became hard. She has not done anything for treatment. Walking barefoot increases the pain. She denies any drainage. Patient is here for further evaluation and treatment.   Past Medical History:  Diagnosis Date  . Anxiety   . Macular degeneration      Objective:  Physical Exam General: Alert and oriented x3 in no acute distress  Dermatology: Hyperkeratotic lesion(s) present on the right great toe. Pain on palpation with a central nucleated core noted.   Wound #1 noted to the left great toe measuring 0.3 x 0.2 x 0.2 cm  To the above-noted ulceration, there is no eschar. There is a moderate amount of slough, fibrin and necrotic tissue. Granulation tissue and wound base is red. There is no malodor. There is a minimal amount of serosanginous drainage noted. Periwound integrity is intact.  Skin is warm, dry and supple bilateral lower extremities.   Vascular: Palpable pedal pulses bilaterally. No edema or erythema noted. Capillary refill within normal limits.  Neurological: Epicritic and protective threshold grossly intact bilaterally.   Musculoskeletal Exam: Pain on palpation at the keratotic lesion(s) noted. Range of motion within normal limits bilateral. Muscle strength 5/5 in all groups bilateral.  Radiographic Exam:  Normal osseous mineralization. Joint spaces preserved. No fracture/dislocation/boney destruction.    Assessment: 1. Ulceration noted to the left great toe 2. Pre-ulcerative callus lesion noted to the right great toe   Plan of Care:  1. Patient evaluated. X-Rays reviewed.  2. Excisional debridement of keratoic lesion using a chisel blade was performed without incident.  3. Dressed area with light dressing.  4. Medically necessary excisional debridement including subcutaneous tissue was performed using a tissue nipper and a chisel blade. Excisional debridement of all the necrotic nonviable tissue down to healthy bleeding viable tissue was performed with post-debridement measurements same as pre-. 5. The wound was cleansed and dry sterile dressing applied. 6. Prescription for Gentamicin cream provided to patient to use daily with a bandage.  7. Recommended patient not go barefoot.  8. Patient is to return to the clinic in 4 weeks.   Edrick Kins, DPM [facility]  Dr. Edrick Kins, [address]                                        [place], [place] [address]                Office [phone]  Fax [phone]

## 2018-12-29 ENCOUNTER — Ambulatory Visit: Payer: Medicare Other | Admitting: Podiatry

## 2019-01-12 ENCOUNTER — Ambulatory Visit (INDEPENDENT_AMBULATORY_CARE_PROVIDER_SITE_OTHER): Payer: Medicare Other | Admitting: Podiatry

## 2019-01-12 ENCOUNTER — Other Ambulatory Visit: Payer: Self-pay

## 2019-01-12 VITALS — Temp 98.3°F

## 2019-01-12 DIAGNOSIS — L989 Disorder of the skin and subcutaneous tissue, unspecified: Secondary | ICD-10-CM | POA: Diagnosis not present

## 2019-01-12 DIAGNOSIS — L97522 Non-pressure chronic ulcer of other part of left foot with fat layer exposed: Secondary | ICD-10-CM | POA: Diagnosis not present

## 2019-01-16 NOTE — Progress Notes (Signed)
   Subjective: 73 y.o. female presenting to the office today for follow up evaluation of an ulceration of the left great toe. She states the wound has healed well. She denies any pain or drainage from the area. She has been using the Gentamicin cream as directed. There are no worsening factors noted. Patient is here for further evaluation and treatment.   Past Medical History:  Diagnosis Date  . Anxiety   . Macular degeneration      Objective:  Physical Exam General: Alert and oriented x3 in no acute distress  Dermatology: Wound noted to the left great toe has healed. Complete re-epithelialization has occurred. No drainage noted.   Vascular: Palpable pedal pulses bilaterally. No edema or erythema noted. Capillary refill within normal limits.  Neurological: Epicritic and protective threshold grossly intact bilaterally.   Musculoskeletal Exam: Range of motion within normal limits bilateral. Muscle strength 5/5 in all groups bilateral.  Assessment: 1. Ulceration noted to the left great toe - healed    Plan of Care:  1. Patient evaluated.  2. Light debridement of the area performed with a tissue nipper.   3. Recommended patient not go barefoot.  4. Patient is to return to the clinic as needed.   Edrick Kins, DPM [facility]  Dr. Edrick Kins, [address]                                        [place], [place] [address]                Office [phone]  Fax [phone]

## 2019-01-24 DIAGNOSIS — Z23 Encounter for immunization: Secondary | ICD-10-CM | POA: Diagnosis not present

## 2019-04-25 ENCOUNTER — Other Ambulatory Visit: Payer: Self-pay | Admitting: Gastroenterology

## 2019-04-25 NOTE — Telephone Encounter (Signed)
Sending in limited refill. She hasn't been seen since 2016. She will need OV for further refills.

## 2019-04-26 ENCOUNTER — Encounter: Payer: Self-pay | Admitting: Internal Medicine

## 2019-04-26 NOTE — Telephone Encounter (Signed)
Nicole Barrett will you schedule the patient an appointment to further refills

## 2019-05-31 ENCOUNTER — Ambulatory Visit (INDEPENDENT_AMBULATORY_CARE_PROVIDER_SITE_OTHER): Payer: Medicare Other | Admitting: Gastroenterology

## 2019-05-31 ENCOUNTER — Encounter: Payer: Self-pay | Admitting: Internal Medicine

## 2019-05-31 ENCOUNTER — Encounter: Payer: Self-pay | Admitting: Gastroenterology

## 2019-05-31 ENCOUNTER — Other Ambulatory Visit: Payer: Self-pay

## 2019-05-31 DIAGNOSIS — K219 Gastro-esophageal reflux disease without esophagitis: Secondary | ICD-10-CM | POA: Diagnosis not present

## 2019-05-31 DIAGNOSIS — Z86010 Personal history of colonic polyps: Secondary | ICD-10-CM

## 2019-05-31 MED ORDER — PANTOPRAZOLE SODIUM 40 MG PO TBEC: DELAYED_RELEASE_TABLET | ORAL | 3 refills | Status: DC

## 2019-05-31 NOTE — Progress Notes (Signed)
Primary Care Physician:  Asencion Noble, MD Primary GI:  Garfield Cornea, MD   Patient Location: Home  Provider Location: [facility] office  Reason for Phone Visit:  Chief Complaint  Patient presents with  . Follow-up    fu gerd,hiatal hernia,needs refill on Pantoprazole     Persons present on the phone encounter, with roles: Patient, myself (provider),Angela Stallings CMA (updated meds and allergies)  Total time (minutes) spent on medical discussion: 7 minutes  Due to COVID-19, visit was conducted using telephonic method (no video was available).  Visit was requested by patient.  Virtual Visit via Telephone only  I connected with Ms. Christiansen on 05/31/19 at 11:30 AM EST by telephone and verified that I am speaking with the correct person using two identifiers.   I discussed the limitations, risks, security and privacy concerns of performing an evaluation and management service by telephone and the availability of in person appointments. I also discussed with the patient that there may be a patient responsible charge related to this service. The patient expressed understanding and agreed to proceed.   HPI:   Patient is a pleasant 74 y/o female who presents for telephone visit regarding GERD. She was last seen in 2016 and required office visit to continue refills on her pantoprazole.   Lately she is doing very well.  She notes that she skips her second dose of pantoprazole she will have nocturnal reflux.  As long she takes twice daily she does well.  No dysphagia.  No abdominal pain.  No unintentional weight loss.  Weight has been in the 165 to 170 pound range.  Bowel movements are regular.  No blood in stool or melena.  Denies any significant changes in her medical history.  Last EGD and colonoscopy in December 2015.  Noncritical Schatzki ring/hiatal hernia, abnormal gastric mucosa with gastric polyps (biopsies benign).  Colonic diverticulosis, tubular adenomas removed, random colon  biopsies negative.  Next colonoscopy December 2022.    Current Outpatient Medications  Medication Sig Dispense Refill  . Calcium Carbonate-Vitamin D (CALCIUM + D PO) Take by mouth daily.    . clonazePAM (KLONOPIN) 0.5 MG tablet TK 1/2 TO 1 T PO BID PRN    . diclofenac Sodium (VOLTAREN) 1 % GEL Apply topically as needed.    . fluticasone (FLONASE) 50 MCG/ACT nasal spray Place 1 spray into both nostrils daily.    . Multiple Vitamins-Minerals (PRESERVISION AREDS 2 PO) Take 1 capsule by mouth daily.     . pantoprazole (PROTONIX) 40 MG tablet TAKE 1 TABLET(40 MG) BY MOUTH TWICE DAILY BEFORE A MEAL 180 tablet 3   No current facility-administered medications for this visit.    Past Medical History:  Diagnosis Date  . Anxiety   . Macular degeneration     Past Surgical History:  Procedure Laterality Date  . CHOLECYSTECTOMY  2011   Dr. Geroge Baseman  . COLONOSCOPY N/A 04/05/2014   [id number] diverticulosis,colonic polyps removed (TA), random colon biopsies negative. next TCS 03/2021  . cyst removed from right hand    . ESOPHAGOGASTRODUODENOSCOPY N/A 04/05/2014   [id number] schazkis ring/HH, abnormal gastric mucosa with gastric polyps (benign)    Family History  Problem Relation Age of Onset  . Pancreatic cancer Mother   . Diabetes Other   . Heart disease Other   . Colon cancer Neg Hx     Social History   Socioeconomic History  . Marital status: Married    Spouse name: Not on file  .  Number of children: 1  . Years of education: Not on file  . Highest education level: Not on file  Occupational History  . Not on file  Tobacco Use  . Smoking status: Former Smoker    Packs/day: 1.50    Years: 20.00    Pack years: 30.00    Types: Cigarettes  . Smokeless tobacco: Never Used  . Tobacco comment: Quit smokking 3 yrs ago  Substance and Sexual Activity  . Alcohol use: Yes    Alcohol/week: 14.0 standard drinks    Types: 14 Glasses of wine per week    Comment: 2 glasses of wine  daily  . Drug use: No  . Sexual activity: Not on file  Other Topics Concern  . Not on file  Social History Narrative  . Not on file   Social Determinants of Health   Financial Resource Strain:   . Difficulty of Paying Living Expenses: Not on file  Food Insecurity:   . Worried About Charity fundraiser in the Last Year: Not on file  . Ran Out of Food in the Last Year: Not on file  Transportation Needs:   . Lack of Transportation (Medical): Not on file  . Lack of Transportation (Non-Medical): Not on file  Physical Activity:   . Days of Exercise per Week: Not on file  . Minutes of Exercise per Session: Not on file  Stress:   . Feeling of Stress : Not on file  Social Connections:   . Frequency of Communication with Friends and Family: Not on file  . Frequency of Social Gatherings with Friends and Family: Not on file  . Attends Religious Services: Not on file  . Active Member of Clubs or Organizations: Not on file  . Attends Archivist Meetings: Not on file  . Marital Status: Not on file  Intimate Partner Violence:   . Fear of Current or Ex-Partner: Not on file  . Emotionally Abused: Not on file  . Physically Abused: Not on file  . Sexually Abused: Not on file      ROS:  General: Negative for anorexia, weight loss, fever, chills, fatigue, weakness. Eyes: Negative for vision changes.  ENT: Negative for hoarseness, difficulty swallowing , nasal congestion. CV: Negative for chest pain, angina, palpitations, dyspnea on exertion, peripheral edema.  Respiratory: Negative for dyspnea at rest, dyspnea on exertion, cough, sputum, wheezing.  GI: See history of present illness. GU:  Negative for dysuria, hematuria, urinary incontinence, urinary frequency, nocturnal urination.  MS: Negative for joint pain, low back pain.  Derm: Negative for rash or itching.  Neuro: Negative for weakness, abnormal sensation, seizure, frequent headaches, memory loss, confusion.  Psych:  Negative for anxiety, depression, suicidal ideation, hallucinations.  Endo: Negative for unusual weight change.  Heme: Negative for bruising or bleeding. Allergy: Negative for rash or hives.   Observations/Objective: Pleasant, alert and oriented, no acute distress.  Assessment and Plan: Pleasant 74 year old female with history of hiatal hernia, GERD presenting via telephone encounter for follow-up.  Doing very well on pantoprazole 40 mg twice daily.  Require second dose to maintain adequate control of her reflux especially nocturnal symptoms.  She will continue current regimen.  Reinforced antireflux measures.  She will be due for surveillance colonoscopy for history of adenomatous colon polyps, December 2022.  We will have her return for an office visit for follow-up of reflux prior to the end and schedule for colonoscopy.  Follow Up Instructions:    I discussed the assessment  and treatment plan with the patient. The patient was provided an opportunity to ask questions and all were answered. The patient agreed with the plan and demonstrated an understanding of the instructions. AVS mailed to patient's home address.   The patient was advised to call back or seek an in-person evaluation if the symptoms worsen or if the condition fails to improve as anticipated.  I provided 7 minutes of non-face-to-face time during this encounter.   Neil Crouch, PA-C

## 2019-05-31 NOTE — Patient Instructions (Signed)
1. Continue pantoprazole 40 mg twice daily before a meal.  New prescription sent to your pharmacy. 2. Return to the office in October 2022, for follow-up of reflux and to schedule your next colonoscopy.

## 2019-05-31 NOTE — Progress Notes (Signed)
Cc'ed to pcp °

## 2019-06-15 ENCOUNTER — Other Ambulatory Visit: Payer: Self-pay

## 2019-06-15 ENCOUNTER — Ambulatory Visit (INDEPENDENT_AMBULATORY_CARE_PROVIDER_SITE_OTHER): Payer: Medicare Other | Admitting: Podiatry

## 2019-06-15 ENCOUNTER — Encounter: Payer: Self-pay | Admitting: Podiatry

## 2019-06-15 DIAGNOSIS — L97522 Non-pressure chronic ulcer of other part of left foot with fat layer exposed: Secondary | ICD-10-CM

## 2019-06-16 DIAGNOSIS — Z23 Encounter for immunization: Secondary | ICD-10-CM | POA: Diagnosis not present

## 2019-06-22 DIAGNOSIS — H6123 Impacted cerumen, bilateral: Secondary | ICD-10-CM | POA: Diagnosis not present

## 2019-06-22 DIAGNOSIS — H903 Sensorineural hearing loss, bilateral: Secondary | ICD-10-CM | POA: Diagnosis not present

## 2019-06-22 DIAGNOSIS — H9203 Otalgia, bilateral: Secondary | ICD-10-CM | POA: Diagnosis not present

## 2019-06-22 DIAGNOSIS — R42 Dizziness and giddiness: Secondary | ICD-10-CM | POA: Diagnosis not present

## 2019-06-22 DIAGNOSIS — H838X3 Other specified diseases of inner ear, bilateral: Secondary | ICD-10-CM | POA: Diagnosis not present

## 2019-06-22 DIAGNOSIS — J310 Chronic rhinitis: Secondary | ICD-10-CM | POA: Diagnosis not present

## 2019-06-22 DIAGNOSIS — J343 Hypertrophy of nasal turbinates: Secondary | ICD-10-CM | POA: Diagnosis not present

## 2019-06-29 NOTE — Progress Notes (Signed)
   HPI: 74 y.o. female presenting today with a chief complaint of a painful lesion noted to the left plantar great toe that reappeared a few days ago. She reports associated drainage. She has not done anything for treatment. Touching the wound and bearing weight increases the pain. Patient is here for further evaluation and treatment.   Past Medical History:  Diagnosis Date  . Anxiety   . Macular degeneration      Physical Exam: General: The patient is alert and oriented x3 in no acute distress.  Dermatology: Wound noted to the left plantar hallux measuring approximately 0.3 x 0.3 x 0.1 cm.  To the above-noted ulceration, there is no eschar. There is a moderate amount of slough, fibrin and necrotic tissue. Granulation tissue and wound base is red. There is no malodor. There is a minimal amount of serosanginous drainage noted. Periwound integrity is intact.  Skin is warm, dry and supple bilateral lower extremities.   Vascular: Palpable pedal pulses bilaterally. No edema or erythema noted. Capillary refill within normal limits.  Neurological: Epicritic and protective threshold grossly intact bilaterally.   Musculoskeletal Exam: Range of motion within normal limits to all pedal and ankle joints bilateral. Muscle strength 5/5 in all groups bilateral.   Assessment: 1. Ulceration of the left plantar hallux    Plan of Care:  1. Patient evaluated. 2. Medically necessary excisional debridement including subcutaneous tissue was performed using a tissue nipper and a chisel blade. Excisional debridement of all the necrotic nonviable tissue down to healthy bleeding viable tissue was performed with post-debridement measurements same as pre-. 3. The wound was cleansed and dry sterile dressing applied. 4. Recommended antibiotic ointment daily with a bandage.  5. Return to clinic in 4 weeks.       Edrick Kins, DPM [facility]  Dr. Edrick Kins, DPM    [address], [place] [address]                Office [phone]  Fax [phone]

## 2019-07-11 DIAGNOSIS — R42 Dizziness and giddiness: Secondary | ICD-10-CM | POA: Diagnosis not present

## 2019-07-11 DIAGNOSIS — J310 Chronic rhinitis: Secondary | ICD-10-CM | POA: Diagnosis not present

## 2019-07-11 DIAGNOSIS — J343 Hypertrophy of nasal turbinates: Secondary | ICD-10-CM | POA: Diagnosis not present

## 2019-07-13 ENCOUNTER — Ambulatory Visit: Payer: Medicare Other | Admitting: Podiatry

## 2019-07-15 DIAGNOSIS — Z23 Encounter for immunization: Secondary | ICD-10-CM | POA: Diagnosis not present

## 2020-01-11 DIAGNOSIS — H6983 Other specified disorders of Eustachian tube, bilateral: Secondary | ICD-10-CM | POA: Diagnosis not present

## 2020-01-11 DIAGNOSIS — H838X3 Other specified diseases of inner ear, bilateral: Secondary | ICD-10-CM | POA: Diagnosis not present

## 2020-01-11 DIAGNOSIS — H903 Sensorineural hearing loss, bilateral: Secondary | ICD-10-CM | POA: Diagnosis not present

## 2020-01-19 DIAGNOSIS — L918 Other hypertrophic disorders of the skin: Secondary | ICD-10-CM | POA: Diagnosis not present

## 2020-01-19 DIAGNOSIS — X32XXXA Exposure to sunlight, initial encounter: Secondary | ICD-10-CM | POA: Diagnosis not present

## 2020-01-19 DIAGNOSIS — L570 Actinic keratosis: Secondary | ICD-10-CM | POA: Diagnosis not present

## 2020-01-24 ENCOUNTER — Ambulatory Visit: Payer: Medicare Other | Admitting: Gastroenterology

## 2020-04-26 ENCOUNTER — Other Ambulatory Visit: Payer: Medicare Other

## 2020-04-26 DIAGNOSIS — Z20822 Contact with and (suspected) exposure to covid-19: Secondary | ICD-10-CM

## 2020-04-27 LAB — SARS-COV-2, NAA 2 DAY TAT

## 2020-04-27 LAB — SPECIMEN STATUS REPORT

## 2020-04-27 LAB — NOVEL CORONAVIRUS, NAA: SARS-CoV-2, NAA: DETECTED — AB

## 2020-09-04 ENCOUNTER — Other Ambulatory Visit: Payer: Self-pay | Admitting: Gastroenterology

## 2020-12-05 ENCOUNTER — Other Ambulatory Visit: Payer: Self-pay | Admitting: Gastroenterology

## 2020-12-06 ENCOUNTER — Encounter: Payer: Self-pay | Admitting: Internal Medicine

## 2020-12-06 NOTE — Telephone Encounter (Signed)
I am refilling patient's pantoprazole.  As she has not been seen since 2021, I recommend office visit for additional refills after this.  Please let patient know and arrange office visit.

## 2020-12-06 NOTE — Telephone Encounter (Signed)
Pt was made aware and informed that someone would be in contact with her to schedule an appt.

## 2021-02-04 DIAGNOSIS — H52223 Regular astigmatism, bilateral: Secondary | ICD-10-CM | POA: Diagnosis not present

## 2021-02-04 DIAGNOSIS — H353131 Nonexudative age-related macular degeneration, bilateral, early dry stage: Secondary | ICD-10-CM | POA: Diagnosis not present

## 2021-02-04 DIAGNOSIS — H40013 Open angle with borderline findings, low risk, bilateral: Secondary | ICD-10-CM | POA: Diagnosis not present

## 2021-02-04 DIAGNOSIS — H16223 Keratoconjunctivitis sicca, not specified as Sjogren's, bilateral: Secondary | ICD-10-CM | POA: Diagnosis not present

## 2021-02-04 DIAGNOSIS — H524 Presbyopia: Secondary | ICD-10-CM | POA: Diagnosis not present

## 2021-02-04 DIAGNOSIS — H5203 Hypermetropia, bilateral: Secondary | ICD-10-CM | POA: Diagnosis not present

## 2021-02-04 DIAGNOSIS — H2513 Age-related nuclear cataract, bilateral: Secondary | ICD-10-CM | POA: Diagnosis not present

## 2021-03-17 DIAGNOSIS — Z23 Encounter for immunization: Secondary | ICD-10-CM | POA: Diagnosis not present

## 2021-03-19 ENCOUNTER — Encounter: Payer: Self-pay | Admitting: *Deleted

## 2021-04-18 DIAGNOSIS — H25813 Combined forms of age-related cataract, bilateral: Secondary | ICD-10-CM | POA: Diagnosis not present

## 2021-04-18 DIAGNOSIS — H01002 Unspecified blepharitis right lower eyelid: Secondary | ICD-10-CM | POA: Diagnosis not present

## 2021-04-18 DIAGNOSIS — H01004 Unspecified blepharitis left upper eyelid: Secondary | ICD-10-CM | POA: Diagnosis not present

## 2021-04-18 DIAGNOSIS — H353131 Nonexudative age-related macular degeneration, bilateral, early dry stage: Secondary | ICD-10-CM | POA: Diagnosis not present

## 2021-04-18 DIAGNOSIS — H01001 Unspecified blepharitis right upper eyelid: Secondary | ICD-10-CM | POA: Diagnosis not present

## 2021-05-16 DIAGNOSIS — H25811 Combined forms of age-related cataract, right eye: Secondary | ICD-10-CM | POA: Diagnosis not present

## 2021-05-20 ENCOUNTER — Encounter (HOSPITAL_COMMUNITY): Payer: Self-pay

## 2021-05-20 ENCOUNTER — Encounter (HOSPITAL_COMMUNITY)
Admission: RE | Admit: 2021-05-20 | Discharge: 2021-05-20 | Disposition: A | Discharge status: Home / Self Care | Payer: Medicare Other | Source: Ambulatory Visit | Attending: Ophthalmology | Admitting: Ophthalmology

## 2021-05-20 HISTORY — DX: Personal history of other diseases of the digestive system: Z87.19

## 2021-05-20 HISTORY — DX: Gastro-esophageal reflux disease without esophagitis: K21.9

## 2021-05-21 NOTE — H&P (Signed)
Surgical History & Physical  Patient Name: Nicole Barrett DOB: 01-23-1946  Surgery: Cataract extraction with intraocular lens implant phacoemulsification; Right Eye  Surgeon: Baruch Goldmann MD Surgery Date:  05-24-21 Pre-Op Date:  05-20-21  HPI: A 15 Yr. old female patient Pt referred by Dr. Hassell Done for cataract evaluation. The patient complains of nighttime light - car headlights, street lamps etc. glare causing poor vision, which began 3+ years ago. Both eyes are affected. The episode is gradual. The condition's severity is worsening. The complaint is associated with blurry vision and glare. Pt has decreased night driving due to vision. This is negatively affecting the patient's quality of life and the patient is unable to function adequately in life with the current level of vision. Pt denies any eye pain or increase in floaters/flashes of light. HPI Completed by Dr. Baruch Goldmann  Medical History: Dry Eyes Macula Degeneration Glaucoma Cataracts Macula Degeneration Glaucoma GERD Anxiety Hiatal Hernia Seasonal Allergies  Review of Systems Negative Allergic/Immunologic Negative Cardiovascular Negative Constitutional Negative Ear, Nose, Mouth & Throat Negative Endocrine Negative Eyes Negative Gastrointestinal Negative Genitourinary Negative Hemotologic/Lymphatic Negative Integumentary Negative Musculoskeletal Negative Neurological Negative Psychiatry Negative Respiratory  Social   Former smoker   Medication Artificial Tears,  Fluticasone, Clonazepam,   Sx/Procedures Gallbladder Removal,   Drug Allergies  Codeine,   History & Physical: Heent: Cataract, Right Eye NECK: supple without bruits LUNGS: lungs clear to auscultation CV: regular rate and rhythm Abdomen: soft and non-tender  Impression & Plan: Assessment: 1.  COMBINED FORMS AGE RELATED CATARACT; Both Eyes (H25.813) 2.  BLEPHARITIS; Right Upper Lid, Right Lower Lid, Left Upper Lid, Left Lower Lid  (H01.001, H01.002,H01.004,H01.005) 3.  DERMATOCHALASIS, no surgery; Right Upper Lid, Left Upper Lid (H02.831, H02.834) 4.  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  Cataract accounts for the patient's decreased vision. This visual impairment is not correctable with a tolerable change in glasses or contact lenses. Cataract surgery with an implantation of a new lens should significantly improve the visual and functional status of the patient. Discussed all risks, benefits, alternatives, and potential complications. Discussed the procedures and recovery. Patient desires to have surgery. A-scan ordered and performed today for intra-ocular lens calculations. The surgery will be performed in order to improve vision for driving, reading, and for eye examinations. Recommend phacoemulsification with intra-ocular lens. Recommend Dextenza for post-operative pain and inflammation. Right Eye worse - first. Dilates well - shugarcaine by protocol.  2.  Recommend regular lid cleaning.  3.  Asymptomatic, recommend observation for now. Findings, prognosis and treatment options reviewed.  4.  Mild drusen OU. OCT macula shows no wet disease. Amsler grid testing weekly. Call with any worsening vision, pain, or any other concerns.

## 2021-05-24 ENCOUNTER — Ambulatory Visit (HOSPITAL_COMMUNITY)
Admission: RE | Admit: 2021-05-24 | Discharge: 2021-05-24 | Disposition: A | Discharge status: Home / Self Care | Payer: Medicare Other | Attending: Ophthalmology | Admitting: Ophthalmology

## 2021-05-24 ENCOUNTER — Ambulatory Visit (HOSPITAL_COMMUNITY): Payer: Medicare Other | Admitting: Anesthesiology

## 2021-05-24 ENCOUNTER — Encounter (HOSPITAL_COMMUNITY)
Admission: RE | Disposition: A | Discharge status: Home / Self Care | Payer: Self-pay | Source: Home / Self Care | Attending: Ophthalmology

## 2021-05-24 ENCOUNTER — Encounter (HOSPITAL_COMMUNITY): Payer: Self-pay | Admitting: Ophthalmology

## 2021-05-24 DIAGNOSIS — Z87891 Personal history of nicotine dependence: Secondary | ICD-10-CM | POA: Insufficient documentation

## 2021-05-24 DIAGNOSIS — H0100B Unspecified blepharitis left eye, upper and lower eyelids: Secondary | ICD-10-CM | POA: Insufficient documentation

## 2021-05-24 DIAGNOSIS — H02831 Dermatochalasis of right upper eyelid: Secondary | ICD-10-CM | POA: Diagnosis not present

## 2021-05-24 DIAGNOSIS — H02834 Dermatochalasis of left upper eyelid: Secondary | ICD-10-CM | POA: Insufficient documentation

## 2021-05-24 DIAGNOSIS — F419 Anxiety disorder, unspecified: Secondary | ICD-10-CM | POA: Diagnosis not present

## 2021-05-24 DIAGNOSIS — H353131 Nonexudative age-related macular degeneration, bilateral, early dry stage: Secondary | ICD-10-CM | POA: Insufficient documentation

## 2021-05-24 DIAGNOSIS — H0100A Unspecified blepharitis right eye, upper and lower eyelids: Secondary | ICD-10-CM | POA: Insufficient documentation

## 2021-05-24 DIAGNOSIS — H25811 Combined forms of age-related cataract, right eye: Secondary | ICD-10-CM | POA: Insufficient documentation

## 2021-05-24 HISTORY — PX: CATARACT EXTRACTION EXTRACAPSULAR: SHX1305

## 2021-05-24 SURGERY — EXTRACTION, CATARACT, WITH IOL INSERTION
Anesthesia: Monitor Anesthesia Care | Site: Eye | Laterality: Right

## 2021-05-24 MED ORDER — MIDAZOLAM HCL 2 MG/2ML IJ SOLN
1.0000 mg | Freq: Once | INTRAMUSCULAR | Status: AC
  Administered 2021-05-24: 1 mg via INTRAVENOUS

## 2021-05-24 MED ORDER — BSS IO SOLN
INTRAOCULAR | Status: DC | PRN
  Administered 2021-05-24: 15 mL via INTRAOCULAR

## 2021-05-24 MED ORDER — LIDOCAINE HCL (PF) 1 % IJ SOLN
INTRAOCULAR | Status: DC | PRN
  Administered 2021-05-24: 1 mL via OPHTHALMIC

## 2021-05-24 MED ORDER — SODIUM HYALURONATE 10 MG/ML IO SOLUTION
PREFILLED_SYRINGE | INTRAOCULAR | Status: DC | PRN
  Administered 2021-05-24: 0.85 mL via INTRAOCULAR

## 2021-05-24 MED ORDER — SODIUM CHLORIDE 0.9% FLUSH
INTRAVENOUS | Status: DC | PRN
  Administered 2021-05-24: 5 mL via INTRAVENOUS

## 2021-05-24 MED ORDER — NEOMYCIN-POLYMYXIN-DEXAMETH 3.5-10000-0.1 OP SUSP
OPHTHALMIC | Status: DC | PRN
  Administered 2021-05-24: 1 [drp] via OPHTHALMIC

## 2021-05-24 MED ORDER — SODIUM HYALURONATE 23MG/ML IO SOSY
PREFILLED_SYRINGE | INTRAOCULAR | Status: DC | PRN
  Administered 2021-05-24: 0.6 mL via INTRAOCULAR

## 2021-05-24 MED ORDER — STERILE WATER FOR IRRIGATION IR SOLN
Status: DC | PRN
  Administered 2021-05-24: 250 mL

## 2021-05-24 MED ORDER — LIDOCAINE HCL 3.5 % OP GEL
1.0000 "application " | Freq: Once | OPHTHALMIC | Status: AC
  Administered 2021-05-24: 1 via OPHTHALMIC

## 2021-05-24 MED ORDER — POVIDONE-IODINE 5 % OP SOLN
OPHTHALMIC | Status: DC | PRN
  Administered 2021-05-24: 1 via OPHTHALMIC

## 2021-05-24 MED ORDER — TETRACAINE HCL 0.5 % OP SOLN
1.0000 [drp] | OPHTHALMIC | Status: AC | PRN
  Administered 2021-05-24 (×3): 1 [drp] via OPHTHALMIC

## 2021-05-24 MED ORDER — EPINEPHRINE PF 1 MG/ML IJ SOLN
INTRAOCULAR | Status: DC | PRN
  Administered 2021-05-24: 500 mL

## 2021-05-24 MED ORDER — MIDAZOLAM HCL 2 MG/2ML IJ SOLN
INTRAMUSCULAR | Status: AC
  Filled 2021-05-24: qty 2

## 2021-05-24 MED ORDER — MIDAZOLAM HCL 2 MG/2ML IJ SOLN
INTRAMUSCULAR | Status: DC | PRN
  Administered 2021-05-24: 1 mg via INTRAVENOUS

## 2021-05-24 MED ORDER — TROPICAMIDE 1 % OP SOLN
1.0000 [drp] | OPHTHALMIC | Status: AC | PRN
  Administered 2021-05-24 (×3): 1 [drp] via OPHTHALMIC
  Filled 2021-05-24: qty 2

## 2021-05-24 MED ORDER — PHENYLEPHRINE HCL 2.5 % OP SOLN
1.0000 [drp] | OPHTHALMIC | Status: AC | PRN
  Administered 2021-05-24 (×3): 1 [drp] via OPHTHALMIC

## 2021-05-24 SURGICAL SUPPLY — 14 items
CATARACT SUITE SIGHTPATH (MISCELLANEOUS) ×3 IMPLANT
CLOTH BEACON ORANGE TIMEOUT ST (SAFETY) ×3 IMPLANT
EYE SHIELD UNIVERSAL CLEAR (GAUZE/BANDAGES/DRESSINGS) ×2 IMPLANT
FEE CATARACT SUITE SIGHTPATH (MISCELLANEOUS) ×1 IMPLANT
GLOVE SURG UNDER POLY LF SZ7 (GLOVE) ×4 IMPLANT
LENS IOL RAYNER 24.0 (Intraocular Lens) ×3 IMPLANT
LENS IOL RAYONE EMV 24.0 (Intraocular Lens) ×1 IMPLANT
NDL HYPO 18GX1.5 BLUNT FILL (NEEDLE) ×1 IMPLANT
NEEDLE HYPO 18GX1.5 BLUNT FILL (NEEDLE) ×3 IMPLANT
PAD ARMBOARD 7.5X6 YLW CONV (MISCELLANEOUS) ×3 IMPLANT
SYR TB 1ML LL NO SAFETY (SYRINGE) ×3 IMPLANT
TAPE SURG TRANSPORE 1 IN (GAUZE/BANDAGES/DRESSINGS) ×1 IMPLANT
TAPE SURGICAL TRANSPORE 1 IN (GAUZE/BANDAGES/DRESSINGS) ×3
WATER STERILE IRR 250ML POUR (IV SOLUTION) ×3 IMPLANT

## 2021-05-24 NOTE — Anesthesia Preprocedure Evaluation (Signed)
Anesthesia Evaluation  Patient identified by MRN, date of birth, ID band Patient awake    Reviewed: Allergy & Precautions, NPO status , Patient's Chart, lab work & pertinent test results  History of Anesthesia Complications Negative for: history of anesthetic complications  Airway Mallampati: III  TM Distance: <3 FB Neck ROM: Full    Dental  (+) Dental Advisory Given, Upper Dentures   Pulmonary former smoker,    Pulmonary exam normal breath sounds clear to auscultation       Cardiovascular Exercise Tolerance: Good Normal cardiovascular exam Rhythm:Regular Rate:Normal     Neuro/Psych PSYCHIATRIC DISORDERS Anxiety    GI/Hepatic Neg liver ROS, hiatal hernia, GERD  Medicated and Controlled,  Endo/Other  negative endocrine ROS  Renal/GU negative Renal ROS     Musculoskeletal negative musculoskeletal ROS (+)   Abdominal   Peds  Hematology negative hematology ROS (+)   Anesthesia Other Findings   Reproductive/Obstetrics negative OB ROS                             Anesthesia Physical Anesthesia Plan  ASA: 2  Anesthesia Plan: MAC   Post-op Pain Management: Minimal or no pain anticipated   Induction:   PONV Risk Score and Plan:   Airway Management Planned: Nasal Cannula and Natural Airway  Additional Equipment:   Intra-op Plan:   Post-operative Plan:   Informed Consent: I have reviewed the patients History and Physical, chart, labs and discussed the procedure including the risks, benefits and alternatives for the proposed anesthesia with the patient or authorized representative who has indicated his/her understanding and acceptance.     Dental advisory given  Plan Discussed with: CRNA and Surgeon  Anesthesia Plan Comments:         Anesthesia Quick Evaluation

## 2021-05-24 NOTE — Anesthesia Postprocedure Evaluation (Signed)
Anesthesia Post Note  Patient: Nicole Barrett  Procedure(s) Performed: CATARACT EXTRACTION PHACO AND INTRAOCULAR LENS PLACEMENT (IOC) (Right: Eye)  Patient location during evaluation: Phase II Anesthesia Type: MAC Level of consciousness: awake and alert and oriented Pain management: pain level controlled Vital Signs Assessment: post-procedure vital signs reviewed and stable Respiratory status: spontaneous breathing, nonlabored ventilation and respiratory function stable Cardiovascular status: stable and blood pressure returned to baseline Postop Assessment: no apparent nausea or vomiting Anesthetic complications: no   No notable events documented.   Last Vitals:  Vitals:   05/24/21 0907 05/24/21 0959  BP: (!) 161/83 (!) 141/64  Pulse: 76   Resp: 14 16  Temp:  36.6 C  SpO2: 98% 100%    Last Pain:  Vitals:   05/24/21 0959  TempSrc: Oral  PainSc: 3                  Ranjit Ashurst C Joellen Tullos

## 2021-05-24 NOTE — Op Note (Signed)
Date of procedure: 05/24/21  Pre-operative diagnosis:  Visually significant combined form age-related cataract, Right Eye (H25.811)  Post-operative diagnosis:  Visually significant combined form age-related cataract, Right Eye (H25.811)  Procedure: Removal of cataract via phacoemulsification and insertion of intra-ocular lens Rayner [id number] +24.0D into the capsular bag of the Right Eye  Attending surgeon: Gerda Diss. Elisandra Deshmukh, MD, MA  Anesthesia: MAC, Topical Akten  Complications: None  Estimated Blood Loss: <82m (minimal)  Specimens: None  Implants: As above  Indications:  Visually significant age-related cataract, Right Eye  Procedure:  The patient was seen and identified in the pre-operative area. The operative eye was identified and dilated.  The operative eye was marked.  Topical anesthesia was administered to the operative eye.     The patient was then to the operative suite and placed in the supine position.  A timeout was performed confirming the patient, procedure to be performed, and all other relevant information.   The patient's face was prepped and draped in the usual fashion for intra-ocular surgery.  A lid speculum was placed into the operative eye and the surgical microscope moved into place and focused.  A superotemporal paracentesis was created using a 20 gauge paracentesis blade.  Shugarcaine was injected into the anterior chamber.  Viscoelastic was injected into the anterior chamber.  A temporal clear-corneal main wound incision was created using a 2.458mmicrokeratome.  A continuous curvilinear capsulorrhexis was initiated using an irrigating cystitome and completed using capsulorrhexis forceps.  Hydrodissection and hydrodeliniation were performed.  Viscoelastic was injected into the anterior chamber.  A phacoemulsification handpiece and a chopper as a second instrument were used to remove the nucleus and epinucleus. The irrigation/aspiration handpiece was used to remove any  remaining cortical material.   The capsular bag was reinflated with viscoelastic, checked, and found to be intact.  The intraocular lens was inserted into the capsular bag.  The irrigation/aspiration handpiece was used to remove any remaining viscoelastic.  The clear corneal wound and paracentesis wounds were then hydrated and checked with Weck-Cels to be watertight.  The lid-speculum was removed.  The drape was removed.  The patient's face was cleaned with a wet and dry 4x4.   Maxitrol was instilled in the eye. A clear shield was taped over the eye. The patient was taken to the post-operative care unit in good condition, having tolerated the procedure well.  Post-Op Instructions: [facility] and will receive all other orders and instructions.

## 2021-05-24 NOTE — Transfer of Care (Signed)
Immediate Anesthesia Transfer of Care Note  Patient: Nicole Barrett  Procedure(s) Performed: CATARACT EXTRACTION PHACO AND INTRAOCULAR LENS PLACEMENT (IOC) (Right: Eye)  Patient Location: Short Stay  Anesthesia Type:MAC  Level of Consciousness: awake, alert  and oriented  Airway & Oxygen Therapy: Patient Spontanous Breathing  Post-op Assessment: Report given to RN and Post -op Vital signs reviewed and stable  Post vital signs: Reviewed and stable  Last Vitals:  Vitals Value Taken Time  BP    Temp    Pulse    Resp    SpO2      Last Pain:  Vitals:   05/24/21 0847  TempSrc: Oral  PainSc: 0-No pain      Patients Stated Pain Goal: 7 (56/31/49 7026)  Complications: No notable events documented.

## 2021-05-24 NOTE — Interval H&P Note (Signed)
History and Physical Interval Note:  05/24/2021 9:36 AM  Nicole Barrett  has presented today for surgery, with the diagnosis of combined forms age related cataract; right eye.  The various methods of treatment have been discussed with the patient and family. After consideration of risks, benefits and other options for treatment, the patient has consented to  Procedure(s) with comments: CATARACT EXTRACTION PHACO AND INTRAOCULAR LENS PLACEMENT (IOC) (Right) - right as a surgical intervention.  The patient's history has been reviewed, patient examined, no change in status, stable for surgery.  I have reviewed the patient's chart and labs.  Questions were answered to the patient's satisfaction.     Baruch Goldmann

## 2021-05-24 NOTE — Anesthesia Procedure Notes (Signed)
Procedure Name: MAC Date/Time: 05/24/2021 9:43 AM Performed by: Orlie Dakin, CRNA Pre-anesthesia Checklist: Patient identified, Emergency Drugs available, Suction available and Patient being monitored Patient Re-evaluated:Patient Re-evaluated prior to induction Oxygen Delivery Method: Nasal cannula Placement Confirmation: positive ETCO2

## 2021-05-24 NOTE — Discharge Instructions (Addendum)
Please discharge patient when stable, will follow up today with Dr. Wrzosek at the [facility] [place] office immediately following discharge.  Leave shield in place until visit.  All paperwork with discharge instructions will be given at the office.  [facility] Address:  [address]  [place], [place] [address]  

## 2021-05-27 ENCOUNTER — Encounter (HOSPITAL_COMMUNITY): Payer: Self-pay | Admitting: Ophthalmology

## 2021-05-30 ENCOUNTER — Encounter (HOSPITAL_COMMUNITY): Payer: Self-pay

## 2021-05-30 ENCOUNTER — Encounter (HOSPITAL_COMMUNITY)
Admission: RE | Admit: 2021-05-30 | Discharge: 2021-05-30 | Disposition: A | Discharge status: Home / Self Care | Payer: Medicare Other | Source: Ambulatory Visit | Attending: Ophthalmology | Admitting: Ophthalmology

## 2021-05-30 DIAGNOSIS — H25812 Combined forms of age-related cataract, left eye: Secondary | ICD-10-CM | POA: Diagnosis not present

## 2021-05-30 HISTORY — DX: Unspecified hearing loss, unspecified ear: H91.90

## 2021-06-03 NOTE — Progress Notes (Unsigned)
Primary Care Physician:  Asencion Noble, MD  Primary Gastroenterologist:    No chief complaint on file.   HPI:  Nicole Barrett is a 76 y.o. female here for consideration of colonoscopy. She has history of GERD.   Last  EGD and colonoscopy in December 2015.  Noncritical Schatzki ring/hiatal hernia, abnormal gastric mucosa with gastric polyps (biopsies benign).  Colonic diverticulosis, tubular adenomas removed, random colon biopsies negative.  Next colonoscopy December 2022.  She has history of nodular hepatic contour and fatty liver appearance on U/S, mild splenomegaly in 2016.   Current Outpatient Medications  Medication Sig Dispense Refill   Calcium Carbonate-Vitamin D (CALCIUM + D PO) Take by mouth daily.     clonazePAM (KLONOPIN) 0.5 MG tablet TK 1/2 TO 1 T PO BID PRN     diclofenac Sodium (VOLTAREN) 1 % GEL Apply topically as needed.     fluticasone (FLONASE) 50 MCG/ACT nasal spray Place 1 spray into both nostrils daily.     Multiple Vitamins-Minerals (PRESERVISION AREDS 2 PO) Take 1 capsule by mouth daily.      pantoprazole (PROTONIX) 40 MG tablet TAKE 1 TABLET(40 MG) BY MOUTH TWICE DAILY BEFORE A MEAL 180 tablet 1   No current facility-administered medications for this visit.    Allergies as of 06/04/2021 - Review Complete 05/24/2021  Allergen Reaction Noted   Codeine Nausea And Vomiting 03/14/2014    Past Medical History:  Diagnosis Date   Anxiety    GERD (gastroesophageal reflux disease)    History of hiatal hernia    HOH (hard of hearing)    Macular degeneration     Past Surgical History:  Procedure Laterality Date   CATARACT EXTRACTION EXTRACAPSULAR Right 05/24/2021   Procedure: CATARACT EXTRACTION PHACO AND INTRAOCULAR LENS PLACEMENT ([place]);  Surgeon: Baruch Goldmann, MD;  Location: [facility];  Service: Ophthalmology;  Laterality: Right;  CDE 12.48   CHOLECYSTECTOMY  2011   Dr. Geroge Baseman   COLONOSCOPY N/A 04/05/2014   HFW:YOVZCHY diverticulosis,colonic polyps removed  (TA), random colon biopsies negative. next TCS 03/2021   cyst removed from right hand     ESOPHAGOGASTRODUODENOSCOPY N/A 04/05/2014   IFO:YDXAJOINOMV schazkis ring/HH, abnormal gastric mucosa with gastric polyps (benign)    Family History  Problem Relation Age of Onset   Pancreatic cancer Mother    Diabetes Other    Heart disease Other    Colon cancer Neg Hx     Social History   Socioeconomic History   Marital status: Married    Spouse name: Not on file   Number of children: 1   Years of education: Not on file   Highest education level: Not on file  Occupational History   Not on file  Tobacco Use   Smoking status: Former    Packs/day: 1.50    Years: 20.00    Pack years: 30.00    Types: Cigarettes   Smokeless tobacco: Never   Tobacco comments:    Quit smokking 3 yrs ago  Substance and Sexual Activity   Alcohol use: Yes    Alcohol/week: 14.0 standard drinks    Types: 14 Glasses of wine per week    Comment: 2 glasses of wine daily   Drug use: No   Sexual activity: Not on file  Other Topics Concern   Not on file  Social History Narrative   Not on file   Social Determinants of Health   Financial Resource Strain: Not on file  Food Insecurity: Not on file  Transportation Needs:  Not on file  Physical Activity: Not on file  Stress: Not on file  Social Connections: Not on file  Intimate Partner Violence: Not on file      ROS:  General: Negative for anorexia, weight loss, fever, chills, fatigue, weakness. Eyes: Negative for vision changes.  ENT: Negative for hoarseness, difficulty swallowing , nasal congestion. CV: Negative for chest pain, angina, palpitations, dyspnea on exertion, peripheral edema.  Respiratory: Negative for dyspnea at rest, dyspnea on exertion, cough, sputum, wheezing.  GI: See history of present illness. GU:  Negative for dysuria, hematuria, urinary incontinence, urinary frequency, nocturnal urination.  MS: Negative for joint pain, low back  pain.  Derm: Negative for rash or itching.  Neuro: Negative for weakness, abnormal sensation, seizure, frequent headaches, memory loss, confusion.  Psych: Negative for anxiety, depression, suicidal ideation, hallucinations.  Endo: Negative for unusual weight change.  Heme: Negative for bruising or bleeding. Allergy: Negative for rash or hives.    Physical Examination:  There were no vitals taken for this visit.   General: Well-nourished, well-developed in no acute distress.  Head: Normocephalic, atraumatic.   Eyes: Conjunctiva pink, no icterus. Mouth: Oropharyngeal mucosa moist and pink , no lesions erythema or exudate. Neck: Supple without thyromegaly, masses, or lymphadenopathy.  Lungs: Clear to auscultation bilaterally.  Heart: Regular rate and rhythm, no murmurs rubs or gallops.  Abdomen: Bowel sounds are normal, nontender, nondistended, no hepatosplenomegaly or masses, no abdominal bruits or    hernia , no rebound or guarding.   Rectal: not performed Extremities: No lower extremity edema. No clubbing or deformities.  Neuro: Alert and oriented x 4 , grossly normal neurologically.  Skin: Warm and dry, no rash or jaundice.   Psych: Alert and cooperative, normal mood and affect.  Labs: Lab Results  Component Value Date   CREATININE 0.92 10/29/2015   BUN 13 10/29/2015   NA 141 10/29/2015   K 3.9 10/29/2015   CL 111 10/29/2015   CO2 23 10/29/2015   Lab Results  Component Value Date   ALT 26 10/29/2015   AST 26 10/29/2015   ALKPHOS 66 10/29/2015   BILITOT 0.8 10/29/2015   Lab Results  Component Value Date   WBC 4.0 10/29/2015   HGB 13.3 10/29/2015   HCT 39.2 10/29/2015   MCV 104.8 (H) 10/29/2015   PLT 98 (L) 10/29/2015     Imaging Studies: No results found.   Assessment:       Plan:

## 2021-06-04 ENCOUNTER — Ambulatory Visit: Payer: Medicare Other | Admitting: Gastroenterology

## 2021-06-04 ENCOUNTER — Other Ambulatory Visit: Payer: Self-pay

## 2021-06-04 NOTE — H&P (Signed)
Surgical History & Physical  Patient Name: Nicole Barrett DOB: 1946/04/17  Surgery: Cataract extraction with intraocular lens implant phacoemulsification; Left Eye  Surgeon: Baruch Goldmann MD Surgery Date:  06-07-21 Pre-Op Date:  05-30-21  HPI: A [name] old female patient is returning after cataract post-op. The right eye is affected. Status post cataract post-op, which began 1 weeks ago: Since the last visit, the affected area is doing well. The patient's vision is improved. Patient is following medication instructions, the patient is using the combination post op drop TID OD. The patient experiences no eye pain and no flashes, floater, shadow, curtain or veil. The patient also presents for pre op OS. The patient states she still really struggles to see small print, and even more difficult to read the computer. This is This is negatively affecting the patient's quality of life and the patient is unable to function adequately in life with the current level of vision. HPI was performed by Baruch Goldmann .  Medical History: Dry Eyes Macula Degeneration Glaucoma Cataracts Macula Degeneration Glaucoma GERD Anxiety Hiatal Hernia Seasonal Allergies  Review of Systems Negative Allergic/Immunologic Negative Cardiovascular Negative Constitutional Negative Ear, Nose, Mouth & Throat Negative Endocrine Negative Eyes Negative Gastrointestinal Negative Genitourinary Negative Hemotologic/Lymphatic Negative Integumentary Negative Musculoskeletal Negative Neurological Negative Psychiatry Negative Respiratory  Social   Former smoker   Medication Artificial Tears, Prednisolone-Moxifloxacin-Bromfenac, Fluticasone, Clonazepam,   Sx/Procedures Phaco c IOL OD, Gallbladder Removal,   Drug Allergies  Codeine,   History & Physical: Heent:  Cataract, Left Eye NECK: supple without bruits LUNGS: lungs clear to auscultation CV: regular rate and rhythm Abdomen: soft and non-tender Impression &  Plan: Assessment: 1.  CATARACT EXTRACTION STATUS; Right Eye (Z98.41) 2.  COMBINED FORMS AGE RELATED CATARACT; Left Eye (H25.812)  Plan: 1.  1 week after cataract surgery. Doing well with improved vision and normal eye pressure. Call with any problems or concerns. Continue Pred-Moxi-Brom 2x/day for 3 more weeks.  2.  Left Eye. Dilates well - shugarcaine by protocol. Cataract accounts for the patient's decreased vision. This visual impairment is not correctable with a tolerable change in glasses or contact lenses. Cataract surgery with an implantation of a new lens should significantly improve the visual and functional status of the patient. Discussed all risks, benefits, alternatives, and potential complications. Discussed the procedures and recovery. Patient desires to have surgery. A-scan ordered and performed today for intra-ocular lens calculations. The surgery will be performed in order to improve vision for driving, reading, and for eye examinations. Recommend phacoemulsification with intra-ocular lens. Recommend Dextenza for post-operative pain and inflammation. Surgery required to correct imbalance of vision.

## 2021-06-07 ENCOUNTER — Ambulatory Visit (HOSPITAL_COMMUNITY): Payer: Medicare Other | Admitting: Anesthesiology

## 2021-06-07 ENCOUNTER — Ambulatory Visit (HOSPITAL_BASED_OUTPATIENT_CLINIC_OR_DEPARTMENT_OTHER): Payer: Medicare Other | Admitting: Anesthesiology

## 2021-06-07 ENCOUNTER — Encounter (HOSPITAL_COMMUNITY): Payer: Self-pay | Admitting: Ophthalmology

## 2021-06-07 ENCOUNTER — Ambulatory Visit (HOSPITAL_COMMUNITY)
Admission: RE | Admit: 2021-06-07 | Discharge: 2021-06-07 | Disposition: A | Discharge status: Home / Self Care | Payer: Medicare Other | Attending: Ophthalmology | Admitting: Ophthalmology

## 2021-06-07 ENCOUNTER — Encounter (HOSPITAL_COMMUNITY)
Admission: RE | Disposition: A | Discharge status: Home / Self Care | Payer: Self-pay | Source: Home / Self Care | Attending: Ophthalmology

## 2021-06-07 DIAGNOSIS — K449 Diaphragmatic hernia without obstruction or gangrene: Secondary | ICD-10-CM | POA: Diagnosis not present

## 2021-06-07 DIAGNOSIS — H25812 Combined forms of age-related cataract, left eye: Secondary | ICD-10-CM | POA: Diagnosis not present

## 2021-06-07 DIAGNOSIS — F419 Anxiety disorder, unspecified: Secondary | ICD-10-CM | POA: Diagnosis not present

## 2021-06-07 DIAGNOSIS — Z9841 Cataract extraction status, right eye: Secondary | ICD-10-CM | POA: Insufficient documentation

## 2021-06-07 DIAGNOSIS — Z87891 Personal history of nicotine dependence: Secondary | ICD-10-CM | POA: Diagnosis not present

## 2021-06-07 HISTORY — PX: CATARACT EXTRACTION W/PHACO: SHX586

## 2021-06-07 SURGERY — PHACOEMULSIFICATION, CATARACT, WITH IOL INSERTION
Anesthesia: Monitor Anesthesia Care | Site: Eye | Laterality: Left

## 2021-06-07 MED ORDER — NEOMYCIN-POLYMYXIN-DEXAMETH 3.5-10000-0.1 OP SUSP
OPHTHALMIC | Status: DC | PRN
  Administered 2021-06-07: 1 [drp] via OPHTHALMIC

## 2021-06-07 MED ORDER — SODIUM HYALURONATE 23MG/ML IO SOSY
PREFILLED_SYRINGE | INTRAOCULAR | Status: DC | PRN
  Administered 2021-06-07: 0.6 mL via INTRAOCULAR

## 2021-06-07 MED ORDER — TETRACAINE HCL 0.5 % OP SOLN
1.0000 [drp] | OPHTHALMIC | Status: AC | PRN
  Administered 2021-06-07 (×3): 1 [drp] via OPHTHALMIC

## 2021-06-07 MED ORDER — BSS IO SOLN
INTRAOCULAR | Status: DC | PRN
  Administered 2021-06-07: 15 mL via INTRAOCULAR

## 2021-06-07 MED ORDER — PHENYLEPHRINE HCL 2.5 % OP SOLN
1.0000 [drp] | OPHTHALMIC | Status: AC | PRN
  Administered 2021-06-07 (×3): 1 [drp] via OPHTHALMIC

## 2021-06-07 MED ORDER — MIDAZOLAM HCL 2 MG/2ML IJ SOLN
INTRAMUSCULAR | Status: DC | PRN
Start: 2021-06-07 — End: 2021-06-07
  Administered 2021-06-07: 1 mg via INTRAVENOUS

## 2021-06-07 MED ORDER — SODIUM CHLORIDE 0.9% FLUSH
INTRAVENOUS | Status: DC | PRN
  Administered 2021-06-07: 5 mL via INTRAVENOUS

## 2021-06-07 MED ORDER — MIDAZOLAM HCL 2 MG/2ML IJ SOLN
INTRAMUSCULAR | Status: AC
  Filled 2021-06-07: qty 2

## 2021-06-07 MED ORDER — LIDOCAINE HCL 3.5 % OP GEL
1.0000 "application " | Freq: Once | OPHTHALMIC | Status: AC
  Administered 2021-06-07: 1 via OPHTHALMIC

## 2021-06-07 MED ORDER — EPINEPHRINE PF 1 MG/ML IJ SOLN
INTRAMUSCULAR | Status: AC
  Filled 2021-06-07: qty 1

## 2021-06-07 MED ORDER — LIDOCAINE HCL (PF) 1 % IJ SOLN
INTRAOCULAR | Status: DC | PRN
  Administered 2021-06-07: 1 mL via OPHTHALMIC

## 2021-06-07 MED ORDER — STERILE WATER FOR IRRIGATION IR SOLN
Status: DC | PRN
  Administered 2021-06-07: 250 mL

## 2021-06-07 MED ORDER — TROPICAMIDE 1 % OP SOLN
1.0000 [drp] | OPHTHALMIC | Status: AC | PRN
  Administered 2021-06-07 (×3): 1 [drp] via OPHTHALMIC
  Filled 2021-06-07: qty 2

## 2021-06-07 MED ORDER — POVIDONE-IODINE 5 % OP SOLN
OPHTHALMIC | Status: DC | PRN
  Administered 2021-06-07: 1 via OPHTHALMIC

## 2021-06-07 MED ORDER — DEXAMETHASONE 0.4 MG OP INST
VAGINAL_INSERT | OPHTHALMIC | Status: AC
  Filled 2021-06-07: qty 1

## 2021-06-07 MED ORDER — SODIUM HYALURONATE 10 MG/ML IO SOLUTION
PREFILLED_SYRINGE | INTRAOCULAR | Status: DC | PRN
  Administered 2021-06-07: 0.85 mL via INTRAOCULAR

## 2021-06-07 MED ORDER — EPINEPHRINE PF 1 MG/ML IJ SOLN
INTRAOCULAR | Status: DC | PRN
  Administered 2021-06-07: 500 mL

## 2021-06-07 SURGICAL SUPPLY — 18 items
CATARACT SUITE SIGHTPATH (MISCELLANEOUS) ×2 IMPLANT
CLOTH BEACON ORANGE TIMEOUT ST (SAFETY) ×2 IMPLANT
EYE SHIELD UNIVERSAL CLEAR (GAUZE/BANDAGES/DRESSINGS) ×1 IMPLANT
FEE CATARACT SUITE SIGHTPATH (MISCELLANEOUS) ×1 IMPLANT
GLOVE SURG UNDER POLY LF SZ6.5 (GLOVE) ×1 IMPLANT
GLOVE SURG UNDER POLY LF SZ7 (GLOVE) ×3 IMPLANT
GOWN STRL REUS W/ TWL LRG LVL3 (GOWN DISPOSABLE) IMPLANT
GOWN STRL REUS W/TWL LRG LVL3 (GOWN DISPOSABLE) ×4
LENS IOL RAYNER 25.5 (Intraocular Lens) ×2 IMPLANT
LENS IOL RAYONE EMV 25.5 (Intraocular Lens) IMPLANT
NDL HYPO 18GX1.5 BLUNT FILL (NEEDLE) ×1 IMPLANT
NEEDLE HYPO 18GX1.5 BLUNT FILL (NEEDLE) ×2 IMPLANT
PAD ARMBOARD 7.5X6 YLW CONV (MISCELLANEOUS) ×2 IMPLANT
RING MALYGIN 7.0 (MISCELLANEOUS) IMPLANT
SYR TB 1ML LL NO SAFETY (SYRINGE) ×2 IMPLANT
TAPE SURG TRANSPORE 1 IN (GAUZE/BANDAGES/DRESSINGS) IMPLANT
TAPE SURGICAL TRANSPORE 1 IN (GAUZE/BANDAGES/DRESSINGS) ×2
WATER STERILE IRR 250ML POUR (IV SOLUTION) ×2 IMPLANT

## 2021-06-07 NOTE — Op Note (Signed)
Date of procedure: 06/07/21  Pre-operative diagnosis: Visually significant age-related combined cataract, Left Eye (H25.812)  Post-operative diagnosis: Visually significant age-related combined cataract, Left Eye (H25.812)  Procedure: Removal of cataract via phacoemulsification and insertion of intra-ocular lens Rayner [id number] +25.5D into the capsular bag of the Left Eye  Attending surgeon: Gerda Diss. Akul Leggette, MD, MA  Anesthesia: MAC, Topical Akten  Complications: None  Estimated Blood Loss: <42m (minimal)  Specimens: None  Implants: As above  Indications:  Visually significant age-related cataract, Left Eye  Procedure:  The patient was seen and identified in the pre-operative area. The operative eye was identified and dilated.  The operative eye was marked.  Topical anesthesia was administered to the operative eye.     The patient was then to the operative suite and placed in the supine position.  A timeout was performed confirming the patient, procedure to be performed, and all other relevant information.   The patient's face was prepped and draped in the usual fashion for intra-ocular surgery.  A lid speculum was placed into the operative eye and the surgical microscope moved into place and focused.  An inferotemporal paracentesis was created using a 20 gauge paracentesis blade.  Shugarcaine was injected into the anterior chamber.  Viscoelastic was injected into the anterior chamber.  A temporal clear-corneal main wound incision was created using a 2.426mmicrokeratome.  A continuous curvilinear capsulorrhexis was initiated using an irrigating cystitome and completed using capsulorrhexis forceps.  Hydrodissection and hydrodeliniation were performed.  Viscoelastic was injected into the anterior chamber.  A phacoemulsification handpiece and a chopper as a second instrument were used to remove the nucleus and epinucleus. The irrigation/aspiration handpiece was used to remove any remaining  cortical material.   The capsular bag was reinflated with viscoelastic, checked, and found to be intact.  The intraocular lens was inserted into the capsular bag.  The irrigation/aspiration handpiece was used to remove any remaining viscoelastic.  The clear corneal wound and paracentesis wounds were then hydrated and checked with Weck-Cels to be watertight.  Maxitrol was instilled in the eye. The lid-speculum was removed.  The drape was removed.  The patient's face was cleaned with a wet and dry 4x4.    A clear shield was taped over the eye. The patient was taken to the post-operative care unit in good condition, having tolerated the procedure well.  Post-Op Instructions: [facility] and will receive all other orders and instructions.

## 2021-06-07 NOTE — Interval H&P Note (Signed)
History and Physical Interval Note:  06/07/2021 1:08 PM  Nicole Barrett  has presented today for surgery, with the diagnosis of combined forms age related cataract; left.  The various methods of treatment have been discussed with the patient and family. After consideration of risks, benefits and other options for treatment, the patient has consented to  Procedure(s) with comments: CATARACT EXTRACTION PHACO AND INTRAOCULAR LENS PLACEMENT (IOC) (Left) - CDE:  as a surgical intervention.  The patient's history has been reviewed, patient examined, no change in status, stable for surgery.  I have reviewed the patient's chart and labs.  Questions were answered to the patient's satisfaction.     Baruch Goldmann

## 2021-06-07 NOTE — Anesthesia Procedure Notes (Signed)
Procedure Name: MAC Date/Time: 06/07/2021 1:17 PM Performed by: Orlie Dakin, CRNA Pre-anesthesia Checklist: Patient identified, Emergency Drugs available, Suction available and Patient being monitored Patient Re-evaluated:Patient Re-evaluated prior to induction Oxygen Delivery Method: Nasal cannula Placement Confirmation: positive ETCO2

## 2021-06-07 NOTE — Discharge Instructions (Addendum)
Please discharge patient when stable, will follow up today with Dr. Wrzosek at the [facility] [place] office immediately following discharge.  Leave shield in place until visit.  All paperwork with discharge instructions will be given at the office.  [facility] Address:  [address]  [place], [place] [address]  

## 2021-06-07 NOTE — Transfer of Care (Signed)
Immediate Anesthesia Transfer of Care Note  Patient: Nicole Barrett  Procedure(s) Performed: CATARACT EXTRACTION PHACO AND INTRAOCULAR LENS PLACEMENT (IOC) (Left: Eye)  Patient Location: Short Stay  Anesthesia Type:MAC  Level of Consciousness: awake, alert  and oriented  Airway & Oxygen Therapy: Patient Spontanous Breathing  Post-op Assessment: Report given to RN and Post -op Vital signs reviewed and stable  Post vital signs: Reviewed and stable  Last Vitals:  Vitals Value Taken Time  BP    Temp    Pulse    Resp    SpO2      Last Pain:  Vitals:   06/07/21 1220  TempSrc: Oral  PainSc: 5       Patients Stated Pain Goal: 7 (16/38/46 6599)  Complications: No notable events documented.

## 2021-06-07 NOTE — Anesthesia Postprocedure Evaluation (Signed)
Anesthesia Post Note  Patient: [place]  Procedure(s) Performed: CATARACT EXTRACTION PHACO AND INTRAOCULAR LENS PLACEMENT (IOC) (Left: Eye)  Patient location during evaluation: Phase II Anesthesia Type: MAC Level of consciousness: awake and alert and oriented Pain management: pain level controlled Vital Signs Assessment: post-procedure vital signs reviewed and stable Respiratory status: spontaneous breathing, nonlabored ventilation and respiratory function stable Cardiovascular status: stable and blood pressure returned to baseline Postop Assessment: no apparent nausea or vomiting Anesthetic complications: no   No notable events documented.   Last Vitals:  Vitals:   06/07/21 1220 06/07/21 1336  BP: (!) 127/101 (!) 143/63  Pulse: 74 80  Resp: 18 17  Temp: 36.9 C 36.7 C  SpO2: 98% 96%    Last Pain:  Vitals:   06/07/21 1336  TempSrc: Oral  PainSc: 0-No pain                 Juquan Reznick C Arriel Victor

## 2021-06-07 NOTE — Anesthesia Preprocedure Evaluation (Signed)
Anesthesia Evaluation  Patient identified by MRN, date of birth, ID band Patient awake    Reviewed: Allergy & Precautions, NPO status , Patient's Chart, lab work & pertinent test results  History of Anesthesia Complications Negative for: history of anesthetic complications  Airway Mallampati: III  TM Distance: <3 FB Neck ROM: Full    Dental  (+) Dental Advisory Given, Upper Dentures   Pulmonary former smoker,    Pulmonary exam normal breath sounds clear to auscultation       Cardiovascular Exercise Tolerance: Good Normal cardiovascular exam Rhythm:Regular Rate:Normal     Neuro/Psych PSYCHIATRIC DISORDERS Anxiety    GI/Hepatic Neg liver ROS, hiatal hernia, GERD  Medicated and Controlled,  Endo/Other  negative endocrine ROS  Renal/GU negative Renal ROS     Musculoskeletal negative musculoskeletal ROS (+)   Abdominal   Peds  Hematology negative hematology ROS (+)   Anesthesia Other Findings   Reproductive/Obstetrics negative OB ROS                             Anesthesia Physical  Anesthesia Plan  ASA: 2  Anesthesia Plan: MAC   Post-op Pain Management: Minimal or no pain anticipated   Induction:   PONV Risk Score and Plan:   Airway Management Planned: Nasal Cannula and Natural Airway  Additional Equipment:   Intra-op Plan:   Post-operative Plan:   Informed Consent: I have reviewed the patients History and Physical, chart, labs and discussed the procedure including the risks, benefits and alternatives for the proposed anesthesia with the patient or authorized representative who has indicated his/her understanding and acceptance.     Dental advisory given  Plan Discussed with: CRNA and Surgeon  Anesthesia Plan Comments:         Anesthesia Quick Evaluation

## 2021-06-10 ENCOUNTER — Encounter (HOSPITAL_COMMUNITY): Payer: Self-pay | Admitting: Ophthalmology

## 2021-06-18 DIAGNOSIS — M25551 Pain in right hip: Secondary | ICD-10-CM | POA: Diagnosis not present

## 2021-06-18 DIAGNOSIS — M5416 Radiculopathy, lumbar region: Secondary | ICD-10-CM | POA: Diagnosis not present

## 2021-06-22 DIAGNOSIS — M545 Low back pain, unspecified: Secondary | ICD-10-CM | POA: Diagnosis not present

## 2021-06-25 DIAGNOSIS — M5416 Radiculopathy, lumbar region: Secondary | ICD-10-CM | POA: Diagnosis not present

## 2021-07-03 DIAGNOSIS — M47816 Spondylosis without myelopathy or radiculopathy, lumbar region: Secondary | ICD-10-CM | POA: Diagnosis not present

## 2021-07-03 DIAGNOSIS — H903 Sensorineural hearing loss, bilateral: Secondary | ICD-10-CM | POA: Diagnosis not present

## 2021-08-13 DIAGNOSIS — M47816 Spondylosis without myelopathy or radiculopathy, lumbar region: Secondary | ICD-10-CM | POA: Diagnosis not present

## 2021-09-03 ENCOUNTER — Other Ambulatory Visit: Payer: Self-pay | Admitting: Gastroenterology

## 2021-09-03 ENCOUNTER — Encounter: Payer: Self-pay | Admitting: Internal Medicine

## 2021-09-03 MED ORDER — PANTOPRAZOLE SODIUM 40 MG PO TBEC: DELAYED_RELEASE_TABLET | ORAL | 0 refills | Status: DC

## 2021-09-03 NOTE — Telephone Encounter (Signed)
Let's have her reschedule ov for refills. Limited refills given today. ?

## 2021-09-03 NOTE — Telephone Encounter (Signed)
Pt scheduled ov for 09/30/2021 at 1:30 with [name], NP. ?

## 2021-09-12 DIAGNOSIS — D23122 Other benign neoplasm of skin of left lower eyelid, including canthus: Secondary | ICD-10-CM | POA: Diagnosis not present

## 2021-09-12 DIAGNOSIS — H04123 Dry eye syndrome of bilateral lacrimal glands: Secondary | ICD-10-CM | POA: Diagnosis not present

## 2021-09-12 DIAGNOSIS — D23111 Other benign neoplasm of skin of right upper eyelid, including canthus: Secondary | ICD-10-CM | POA: Diagnosis not present

## 2021-09-12 DIAGNOSIS — H01001 Unspecified blepharitis right upper eyelid: Secondary | ICD-10-CM | POA: Diagnosis not present

## 2021-09-26 NOTE — Progress Notes (Deleted)
GI Office Note    Referring Provider: Asencion Noble, MD Primary Care Physician:  Asencion Noble, MD Primary GI: Dr. Gala Romney  Date:  09/26/2021  ID:  Nicole Barrett, DOB 11-19-1945, MRN [id number]   Chief Complaint   No chief complaint on file.    History of Present Illness  Nicole Barrett is a 76 y.o. female with a history of hiatal hernia, GERD, anxiety, macular degeneration*** presenting today for follow-up of GERD for further refills of pantoprazole.  Last EGD and colonoscopy December 2015.  EGD with noncritical Schatzki's ring, small hiatal hernia, abnormal gastric mucosa with gastric polyps (benign biopsy).  Colonoscopy with diverticulosis, tubular adenomas removed, random colon biopsies negative.  Due for repeat colonoscopy December 2022.   Last seen in the office February 2021 via telephone visit.  She had follow-up to continue refills for pantoprazole.  She reports she been doing very well, stating that when she skips her second dose of pantoprazole she will have nocturnal reflux.  Denied dysphagia, abdominal pain, unintentional weight loss.  Reported regular bowel movements, denying any melena or hematochezia.  Today: GERD -    Screening for colon cancer    Past Medical History:  Diagnosis Date   Anxiety    GERD (gastroesophageal reflux disease)    History of hiatal hernia    HOH (hard of hearing)    Macular degeneration     Past Surgical History:  Procedure Laterality Date   CATARACT EXTRACTION EXTRACAPSULAR Right 05/24/2021   Procedure: CATARACT EXTRACTION PHACO AND INTRAOCULAR LENS PLACEMENT ([place]);  Surgeon: Baruch Goldmann, MD;  Location: [facility];  Service: Ophthalmology;  Laterality: Right;  CDE 12.48   CATARACT EXTRACTION W/PHACO Left 06/07/2021   Procedure: CATARACT EXTRACTION PHACO AND INTRAOCULAR LENS PLACEMENT ([facility]);  Surgeon: Baruch Goldmann, MD;  Location: [facility];  Service: Ophthalmology;  Laterality: Left;  CDE: 5.36   CHOLECYSTECTOMY  2011   Dr. Geroge Baseman    COLONOSCOPY N/A 04/05/2014   [id number]:[id number] diverticulosis,colonic polyps removed (TA), random colon biopsies negative. next TCS 03/2021   cyst removed from right hand     ESOPHAGOGASTRODUODENOSCOPY N/A 04/05/2014   [id number]:[id number] schazkis ring/HH, abnormal gastric mucosa with gastric polyps (benign)    Current Outpatient Medications  Medication Sig Dispense Refill   Calcium Carbonate-Vitamin D (CALCIUM + D PO) Take by mouth daily.     clonazePAM (KLONOPIN) 0.5 MG tablet TK 1/2 TO 1 T PO BID PRN     diclofenac Sodium (VOLTAREN) 1 % GEL Apply topically as needed.     fluticasone (FLONASE) 50 MCG/ACT nasal spray Place 1 spray into both nostrils daily.     Multiple Vitamins-Minerals (PRESERVISION AREDS 2 PO) Take 1 capsule by mouth daily.      pantoprazole (PROTONIX) 40 MG tablet OFFICE VISIT NEEDED BEFORE FURTHER REFILLS 180 tablet 0   No current facility-administered medications for this visit.    Allergies as of 09/30/2021 - Review Complete 06/07/2021  Allergen Reaction Noted   Codeine Nausea And Vomiting 03/14/2014    Family History  Problem Relation Age of Onset   Pancreatic cancer Mother    Diabetes Other    Heart disease Other    Colon cancer Neg Hx     Social History   Socioeconomic History   Marital status: Married    Spouse name: Not on file   Number of children: 1   Years of education: Not on file   Highest education level: Not on file  Occupational History   Not on  file  Tobacco Use   Smoking status: Former    Packs/day: 1.50    Years: 20.00    Total pack years: 30.00    Types: Cigarettes   Smokeless tobacco: Never   Tobacco comments:    Quit smokking 3 yrs ago  Substance and Sexual Activity   Alcohol use: Yes    Alcohol/week: 14.0 standard drinks of alcohol    Types: 14 Glasses of wine per week    Comment: 2 glasses of wine daily   Drug use: No   Sexual activity: Not on file  Other Topics Concern   Not on file  Social History Narrative   Not on  file   Social Determinants of Health   Financial Resource Strain: Not on file  Food Insecurity: Not on file  Transportation Needs: Not on file  Physical Activity: Not on file  Stress: Not on file  Social Connections: Not on file     Review of Systems   Gen: Denies fever, chills, anorexia. Denies fatigue, weakness, weight loss.  CV: Denies chest pain, palpitations, syncope, peripheral edema, and claudication. Resp: Denies dyspnea at rest, cough, wheezing, coughing up blood, and pleurisy. GI: Denies vomiting blood, jaundice, and fecal incontinence.   Denies dysphagia or odynophagia. Derm: Denies rash, itching, dry skin Psych: Denies depression, anxiety, memory loss, confusion. No homicidal or suicidal ideation.  Heme: Denies bruising, bleeding, and enlarged lymph nodes.   Physical Exam   There were no vitals taken for this visit.  General:   Alert and oriented. No distress noted. Pleasant and cooperative.  Head:  Normocephalic and atraumatic. Eyes:  Conjuctiva clear without scleral icterus. Mouth:  Oral mucosa pink and moist. Good dentition. No lesions. Lungs:  Clear to auscultation bilaterally. No wheezes, rales, or rhonchi. No distress.  Heart:  S1, S2 present without murmurs appreciated.  Abdomen:  +BS, soft, non-tender and non-distended. No rebound or guarding. No HSM or masses noted. Rectal: *** Msk:  Symmetrical without gross deformities. Normal posture. Extremities:  Without edema. Neurologic:  Alert and  oriented x4 Psych:  Alert and cooperative. Normal mood and affect.   Assessment  Nicole Barrett is a 76 y.o. female with a history of hiatal hernia, GERD, anxiety, macular degeneration presenting for follow up of GERD.  GERD:   Overdue for screening colonoscopy (December 2022).   PLAN   *** Continue pantoprazole 40 mg twice daily, refill sent today. GERD diet/lifestyle modifications reinforced.     [name], MSN, FNP-BC, AGACNP-BC [facility]

## 2021-09-30 ENCOUNTER — Ambulatory Visit: Payer: Medicare Other | Admitting: Gastroenterology

## 2021-10-07 DIAGNOSIS — L919 Hypertrophic disorder of the skin, unspecified: Secondary | ICD-10-CM | POA: Diagnosis not present

## 2021-10-07 DIAGNOSIS — D23111 Other benign neoplasm of skin of right upper eyelid, including canthus: Secondary | ICD-10-CM | POA: Diagnosis not present

## 2021-10-07 DIAGNOSIS — D23122 Other benign neoplasm of skin of left lower eyelid, including canthus: Secondary | ICD-10-CM | POA: Diagnosis not present

## 2021-10-08 ENCOUNTER — Telehealth: Payer: Self-pay | Admitting: *Deleted

## 2021-10-08 ENCOUNTER — Telehealth (INDEPENDENT_AMBULATORY_CARE_PROVIDER_SITE_OTHER): Payer: Medicare Other | Admitting: Gastroenterology

## 2021-10-08 ENCOUNTER — Encounter: Payer: Self-pay | Admitting: Gastroenterology

## 2021-10-08 VITALS — Ht 65.0 in | Wt 140.0 lb

## 2021-10-08 DIAGNOSIS — K219 Gastro-esophageal reflux disease without esophagitis: Secondary | ICD-10-CM

## 2021-10-08 MED ORDER — PANTOPRAZOLE SODIUM 40 MG PO TBEC: DELAYED_RELEASE_TABLET | ORAL | 3 refills | Status: DC

## 2021-10-08 MED ORDER — PANTOPRAZOLE SODIUM 40 MG PO TBEC: 40.0000 mg | DELAYED_RELEASE_TABLET | Freq: Two times a day (BID) | ORAL | 3 refills | Status: DC

## 2021-10-08 NOTE — Addendum Note (Signed)
Addended by: Sherron Monday on: 10/08/2021 02:34 PM   Modules accepted: Orders

## 2021-10-08 NOTE — Progress Notes (Signed)
Primary Care Physician:  Asencion Noble, MD  Primary GI: Dr. Gala Romney   Patient Location: Home   Provider Location: [facility] office   Reason for Visit: GERD follow up   Persons present on the virtual encounter, with roles: Palm Barrett - patient, [name], NP   Total time (minutes) spent on medical discussion: 11 minutes  Virtual Visit Encounter Note Visit is conducted virtually and was requested by patient.   I connected with Nicole Barrett on 10/08/21 at  2:00 PM EDT by video and verified that I am speaking with the correct person using two identifiers.   I discussed the limitations, risks, security and privacy concerns of performing an evaluation and management service by video and the availability of in person appointments. I also discussed with the patient that there may be a patient responsible charge related to this service. The patient expressed understanding and agreed to proceed.  Chief Complaint  Patient presents with   Follow-up    Medication follow up      History of Present Illness: Nicole Barrett is a 76 y.o. female with a history of hiatal hernia, GERD, anxiety, macular degeneration presenting today for follow-up of GERD for further refills of pantoprazole.   Last EGD and colonoscopy December 2015.  EGD with noncritical Schatzki's ring, small hiatal hernia, abnormal gastric mucosa with gastric polyps (benign biopsy). Colonoscopy with diverticulosis, tubular adenomas removed, random colon biopsies negative.  Due for repeat colonoscopy December 2022.    Last seen in the office February 2021 via telephone visit.  She had follow-up to continue refills for pantoprazole.  She reports she been doing very well, stating that when she skips her second dose of pantoprazole she will have nocturnal reflux.  Denied dysphagia, abdominal pain, unintentional weight loss.  Reported regular bowel movements, denying any melena or hematochezia.   Today: GERD - Sometimes in the  evening she forgets to take her medication she will have symptoms. Raising her two 16 yr old grandsons it gets hectic (they are autistic). She prefers her main meal to be at lunch and have a lighter dinner. Taking the Protonix twice a day. Denies any otc meds for breakthrough, if she forgets to take her medication she will take it once she remembers. Denies dysphagia. Does have a lack of appetite but due to nerves. Weight has been stable. Denies abdominal pain.   Denies change in bowel habits, no melena, or hematochezia. Goes daily in the mornings after her tea.   Had growths removed from her eyes yesterday that are fairly sore.      Past Medical History:  Diagnosis Date   Anxiety    GERD (gastroesophageal reflux disease)    History of hiatal hernia    HOH (hard of hearing)    Macular degeneration      Past Surgical History:  Procedure Laterality Date   CATARACT EXTRACTION EXTRACAPSULAR Right 05/24/2021   Procedure: CATARACT EXTRACTION PHACO AND INTRAOCULAR LENS PLACEMENT ([place]);  Surgeon: Baruch Goldmann, MD;  Location: [facility];  Service: Ophthalmology;  Laterality: Right;  CDE 12.48   CATARACT EXTRACTION W/PHACO Left 06/07/2021   Procedure: CATARACT EXTRACTION PHACO AND INTRAOCULAR LENS PLACEMENT ([facility]);  Surgeon: Baruch Goldmann, MD;  Location: [facility];  Service: Ophthalmology;  Laterality: Left;  CDE: 5.36   CHOLECYSTECTOMY  2011   Dr. Geroge Baseman   COLONOSCOPY N/A 04/05/2014   GQQ:[id number] diverticulosis,colonic polyps removed (TA), random colon biopsies negative. next TCS 03/2021   cyst removed from right hand  ESOPHAGOGASTRODUODENOSCOPY N/A 04/05/2014   [id number]:[id number] schazkis ring/HH, abnormal gastric mucosa with gastric polyps (benign)     Current Meds  Medication Sig   Apoaequorin (PREVAGEN) 10 MG CAPS Take by mouth.   Calcium Carbonate-Vitamin D (CALCIUM + D PO) Take by mouth daily.   clonazePAM (KLONOPIN) 0.5 MG tablet TK 1/2 TO 1 T PO BID PRN   diclofenac Sodium (VOLTAREN)  1 % GEL Apply topically as needed.   fexofenadine (ALLEGRA) 180 MG tablet Take 180 mg by mouth daily.   Multiple Vitamins-Minerals (PRESERVISION AREDS 2 PO) Take 1 capsule by mouth daily.    [DISCONTINUED] pantoprazole (PROTONIX) 40 MG tablet OFFICE VISIT NEEDED BEFORE FURTHER REFILLS     Family History  Problem Relation Age of Onset   Pancreatic cancer Mother    Diabetes Other    Heart disease Other    Colon cancer Neg Hx     Social History   Socioeconomic History   Marital status: Married    Spouse name: Not on file   Number of children: 1   Years of education: Not on file   Highest education level: Not on file  Occupational History   Not on file  Tobacco Use   Smoking status: Former    Packs/day: 1.50    Years: 20.00    Total pack years: 30.00    Types: Cigarettes   Smokeless tobacco: Never   Tobacco comments:    Quit smokking 3 yrs ago  Substance and Sexual Activity   Alcohol use: Yes    Alcohol/week: 14.0 standard drinks of alcohol    Types: 14 Glasses of wine per week    Comment: 2 glasses of wine daily   Drug use: No   Sexual activity: Not on file  Other Topics Concern   Not on file  Social History Narrative   Not on file   Social Determinants of Health   Financial Resource Strain: Not on file  Food Insecurity: Not on file  Transportation Needs: Not on file  Physical Activity: Not on file  Stress: Not on file  Social Connections: Not on file     Review of Systems: Gen: Denies fever, chills, anorexia. Denies fatigue, weakness, weight loss.  CV: Denies chest pain, palpitations, syncope, peripheral edema, and claudication. Resp: Denies dyspnea at rest, cough, wheezing, coughing up blood, and pleurisy. GI: see HPI Derm: Denies rash, itching, dry skin Psych: Denies depression, anxiety, memory loss, confusion. No homicidal or suicidal ideation.  Heme: Denies bruising, bleeding, and enlarged lymph nodes.  Observations/Objective: No distress. Alert and  oriented. Pleasant. Well nourished. Normal mood and affect. Unable to perform complete physical exam due to video encounter. No video available.    Assessment: Nicole Barrett is a 76 y.o. female with a history of hiatal hernia, GERD, anxiety, macular degeneration presenting for follow up of GERD.   GERD: Doing well on pantoprazole 40 mg twice daily.  Denies any frequent breakthrough symptoms.  She states that she will have symptoms if she forgets to take her medication in the evenings.  She does admit to using over-the-counter medications along with her prescription.  Has a good diet.  No alarm symptoms present.  Patient states that she will have lack of appetite at times but is more so related to her nerves if she is stressed out or upset.  Weight has been stable.  History of colon polyps: Overdue for screening colonoscopy (December 2022).  Given patient's age and no lower GI concerns patient would  not like to pursue colonoscopy.  She stated that if she wanted to check that she would do Cologuard.  We discussed that given her history of colon polyps she likely could have positive Cologuard which would not warrant a colonoscopy.   Plan: Continue pantoprazole 40 mg twice daily, refill sent today. GERD diet/lifestyle modifications reinforced.  Patient will follow-up if she decides she wants to complete Cologuard Follow-up in 1 year or sooner if needed   Follow Up Instructions:   I discussed the assessment and treatment plan with the patient. The patient was provided an opportunity to ask questions and all were answered. The patient agreed with the plan and demonstrated an understanding of the instructions.   The patient was advised to call back or seek an in-person evaluation if the symptoms worsen or if the condition fails to improve as anticipated.    [name], MSN, FNP-BC, AGACNP-BC [facility]

## 2021-10-08 NOTE — Patient Instructions (Signed)
Continue pantoprazole 40 mg twice daily, year supply sent to pharmacy.  If you have any GI concerns such as change in bowel habits, rectal bleeding, or black stools please contact the office as this would indicate need to complete colonoscopy.  Given that you are over 75 we can continue to monitor for symptoms.  Cologuard is not a option for you however given your history of colon polyps it is likely that it could come back positive which would then warrant a colonoscopy.  Please contact the office and let us know if you decide to proceed with Cologuard.  We will have you follow-up in 1 year or sooner as needed.  It was a pleasure speaking with you today!  I hope you have a great summer!  It was a pleasure to see you today. I want to create trusting relationships with patients. If you receive a survey regarding your visit,  I greatly appreciate you taking time to fill this out on paper or through your MyChart. I value your feedback.  [name], MSN, FNP-BC, AGACNP-BC [facility]

## 2021-10-08 NOTE — Telephone Encounter (Signed)
Nicole Barrett, you are scheduled for a virtual visit with your provider today.  Just as we do with appointments in the office, we must obtain your consent to participate.  Your consent will be active for this visit and any virtual visit you may have with one of our providers in the next 365 days.  If you have a MyChart account, I can also send a copy of this consent to you electronically.  All virtual visits are billed to your insurance company just like a traditional visit in the office.  As this is a virtual visit, video technology does not allow for your provider to perform a traditional examination.  This may limit your provider's ability to fully assess your condition.  If your provider identifies any concerns that need to be evaluated in person or the need to arrange testing such as labs, EKG, etc, we will make arrangements to do so.  Although advances in technology are sophisticated, we cannot ensure that it will always work on either your end or our end.  If the connection with a video visit is poor, we may have to switch to a telephone visit.  With either a video or telephone visit, we are not always able to ensure that we have a secure connection.   I need to obtain your verbal consent now.   Are you willing to proceed with your visit today?  Pt consents to virtual visit.

## 2021-11-14 DIAGNOSIS — J310 Chronic rhinitis: Secondary | ICD-10-CM | POA: Diagnosis not present

## 2021-11-14 DIAGNOSIS — J342 Deviated nasal septum: Secondary | ICD-10-CM | POA: Diagnosis not present

## 2021-11-14 DIAGNOSIS — H903 Sensorineural hearing loss, bilateral: Secondary | ICD-10-CM | POA: Diagnosis not present

## 2021-11-14 DIAGNOSIS — J343 Hypertrophy of nasal turbinates: Secondary | ICD-10-CM | POA: Diagnosis not present

## 2021-11-27 ENCOUNTER — Other Ambulatory Visit: Payer: Self-pay | Admitting: Otolaryngology

## 2021-11-27 ENCOUNTER — Other Ambulatory Visit (HOSPITAL_COMMUNITY): Payer: Self-pay | Admitting: Otolaryngology

## 2021-11-27 DIAGNOSIS — J320 Chronic maxillary sinusitis: Secondary | ICD-10-CM

## 2021-12-05 ENCOUNTER — Ambulatory Visit (HOSPITAL_COMMUNITY)
Admission: RE | Admit: 2021-12-05 | Discharge: 2021-12-05 | Disposition: A | Discharge status: Home / Self Care | Payer: Medicare Other | Source: Ambulatory Visit | Attending: Otolaryngology | Admitting: Otolaryngology

## 2021-12-05 DIAGNOSIS — J3489 Other specified disorders of nose and nasal sinuses: Secondary | ICD-10-CM | POA: Diagnosis not present

## 2021-12-05 DIAGNOSIS — J320 Chronic maxillary sinusitis: Secondary | ICD-10-CM | POA: Insufficient documentation

## 2021-12-07 ENCOUNTER — Ambulatory Visit
Admission: EM | Admit: 2021-12-07 | Discharge: 2021-12-07 | Disposition: A | Discharge status: Home / Self Care | Payer: Medicare Other | Attending: Nurse Practitioner | Admitting: Nurse Practitioner

## 2021-12-07 ENCOUNTER — Encounter: Payer: Self-pay | Admitting: Emergency Medicine

## 2021-12-07 DIAGNOSIS — J019 Acute sinusitis, unspecified: Secondary | ICD-10-CM | POA: Diagnosis not present

## 2021-12-07 MED ORDER — AMOXICILLIN-POT CLAVULANATE 875-125 MG PO TABS: 1.0000 | ORAL_TABLET | Freq: Two times a day (BID) | ORAL | 0 refills | Status: DC

## 2021-12-07 MED ORDER — PSEUDOEPH-BROMPHEN-DM 30-2-10 MG/5ML PO SYRP: 5.0000 mL | ORAL_SOLUTION | Freq: Four times a day (QID) | ORAL | 0 refills | Status: DC | PRN

## 2021-12-07 NOTE — ED Triage Notes (Signed)
Facial pain and sinus issues x 1 month.  Had sinus CT done on Thursday.  States face and ears hurts.  Unable to take prednisone due to upsetting stomach.

## 2021-12-07 NOTE — Discharge Instructions (Addendum)
Take medication as directed. Increase fluids and get plenty of rest. May take over-the-counter ibuprofen or Tylenol as needed for pain, fever, or general discomfort. Continue normal saline nasal spray to help with nasal congestion throughout the day. For your cough, it may be helpful to use a humidifier at bedtime during sleep. As discussed, please contact Dr. Deeann Saint office for an evaluation this week to review your CT scan.

## 2021-12-07 NOTE — ED Provider Notes (Signed)
[facility]    CSN: [id number] Arrival date & time: 12/07/21  1300      History   Chief Complaint No chief complaint on file.   HPI Nicole Barrett is a 76 y.o. female.   The history is provided by the patient and the spouse.   Patient presents for complaints of sinus pain and pressure that has been present for the past month.  Patient states over the past several days, she has had worsening facial pain, postnasal drainage, and nasal congestion.  Patient states that she has had a CT done last week.  She also complains of bilateral ear pain.  Patient was prescribed prednisone by her PCP, but she had to stop the medication because it was upsetting her stomach.  She denies fever, chills, headache, sore throat, wheezing, shortness of breath, or difficulty breathing.  Patient states that she has been using normal saline nasal spray for her symptoms.  Past Medical History:  Diagnosis Date   Anxiety    GERD (gastroesophageal reflux disease)    History of hiatal hernia    HOH (hard of hearing)    Macular degeneration     Patient Active Problem List   Diagnosis Date Noted   Non-alcoholic fatty liver disease 07/05/2014   History of colonic polyps    Gastric polyp    Chronic diarrhea 03/14/2014   GERD (gastroesophageal reflux disease) 03/14/2014   Rectal bleeding 03/14/2014   Dyspepsia 03/14/2014    Past Surgical History:  Procedure Laterality Date   CATARACT EXTRACTION EXTRACAPSULAR Right 05/24/2021   Procedure: CATARACT EXTRACTION PHACO AND INTRAOCULAR LENS PLACEMENT ([place]);  Surgeon: Baruch Goldmann, MD;  Location: [facility];  Service: Ophthalmology;  Laterality: Right;  CDE 12.48   CATARACT EXTRACTION W/PHACO Left 06/07/2021   Procedure: CATARACT EXTRACTION PHACO AND INTRAOCULAR LENS PLACEMENT ([facility]);  Surgeon: Baruch Goldmann, MD;  Location: [facility];  Service: Ophthalmology;  Laterality: Left;  CDE: 5.36   CHOLECYSTECTOMY  2011   Dr. Geroge Baseman   COLONOSCOPY N/A 04/05/2014    DJS:HFWYOVZ diverticulosis,colonic polyps removed (TA), random colon biopsies negative. next TCS 03/2021   cyst removed from right hand     ESOPHAGOGASTRODUODENOSCOPY N/A 04/05/2014   CHY:IFOYDXAJOIN schazkis ring/HH, abnormal gastric mucosa with gastric polyps (benign)    OB History   No obstetric history on file.      Home Medications    Prior to Admission medications   Medication Sig Start Date End Date Taking? Authorizing Provider  amoxicillin-clavulanate (AUGMENTIN) 875-125 MG tablet Take 1 tablet by mouth every 12 (twelve) hours. 12/07/21  Yes Dionna Wiedemann-Warren, Alda Lea, NP  brompheniramine-pseudoephedrine-DM 30-2-10 MG/5ML syrup Take 5 mLs by mouth 4 (four) times daily as needed. 12/07/21  Yes Romanda Turrubiates-Warren, Alda Lea, NP  Apoaequorin (PREVAGEN) 10 MG CAPS Take by mouth.    [provider]  Calcium Carbonate-Vitamin D (CALCIUM + D PO) Take by mouth daily.    [provider]  clonazePAM (KLONOPIN) 0.5 MG tablet TK 1/2 TO 1 T PO BID PRN 11/30/18   [provider]  diclofenac Sodium (VOLTAREN) 1 % GEL Apply topically as needed.    [provider]  fexofenadine (ALLEGRA) 180 MG tablet Take 180 mg by mouth daily.    [provider]  Multiple Vitamins-Minerals (PRESERVISION AREDS 2 PO) Take 1 capsule by mouth daily.     [provider]  pantoprazole (PROTONIX) 40 MG tablet Take 1 tablet (40 mg total) by mouth 2 (two) times daily. 10/08/21   Mahon,  Lenise Arena, NP    Family History Family History  Problem Relation Age of Onset   Pancreatic cancer Mother    Diabetes Other    Heart disease Other    Colon cancer Neg Hx     Social History Social History   Tobacco Use   Smoking status: Former    Packs/day: 1.50    Years: 20.00    Total pack years: 30.00    Types: Cigarettes   Smokeless tobacco: Never   Tobacco comments:    Quit smokking 3 yrs ago  Substance Use Topics   Alcohol use: Yes    Alcohol/week: 14.0 standard  drinks of alcohol    Types: 14 Glasses of wine per week    Comment: 2 glasses of wine daily   Drug use: No     Allergies   Codeine   Review of Systems Review of Systems Per HPI  Physical Exam Triage Vital Signs ED Triage Vitals  Enc Vitals Group     BP 12/07/21 1345 (!) 152/80     Pulse Rate 12/07/21 1345 79     Resp 12/07/21 1345 18     Temp 12/07/21 1345 98.3 F (36.8 C)     Temp Source 12/07/21 1345 Oral     SpO2 12/07/21 1345 98 %     Weight --      Height --      Head Circumference --      [place] Flow --      Pain Score 12/07/21 1346 7     Pain Loc --      Pain Edu? --      Excl. in [place]? --    No data found.  Updated Vital Signs BP (!) 152/80 (BP Location: Right Arm)   Pulse 79   Temp 98.3 F (36.8 C) (Oral)   Resp 18   SpO2 98%   Visual Acuity Right Eye Distance:   Left Eye Distance:   Bilateral Distance:    Right Eye Near:   Left Eye Near:    Bilateral Near:     Physical Exam Vitals and nursing note reviewed.  Constitutional:      General: She is not in acute distress.    Appearance: Normal appearance.  HENT:     Head: Normocephalic.     Right Ear: Tympanic membrane, ear canal and external ear normal.     Left Ear: Tympanic membrane, ear canal and external ear normal.     Nose: Congestion present.     Right Turbinates: Enlarged and swollen.     Left Turbinates: Enlarged and swollen.     Right Sinus: Maxillary sinus tenderness and frontal sinus tenderness present.     Left Sinus: Maxillary sinus tenderness and frontal sinus tenderness present.     Mouth/Throat:     Lips: Pink.     Mouth: Mucous membranes are moist.     Pharynx: No oropharyngeal exudate or posterior oropharyngeal erythema.     Tonsils: No tonsillar exudate.  Eyes:     General: Vision grossly intact.     Extraocular Movements: Extraocular movements intact.     Conjunctiva/sclera: Conjunctivae normal.     Pupils: Pupils are equal, round, and reactive to light.      Comments: Mild swelling of the bilateral upper/lower eyelids.  Cardiovascular:     Rate and Rhythm: Normal rate and regular rhythm.     Pulses: Normal pulses.     Heart sounds: Normal heart sounds.  Pulmonary:  Effort: Pulmonary effort is normal. No respiratory distress.     Breath sounds: Normal breath sounds. No wheezing or rales.  Abdominal:     General: Bowel sounds are normal.     Palpations: Abdomen is soft.  Musculoskeletal:     Cervical back: Normal range of motion.  Lymphadenopathy:     Cervical: No cervical adenopathy.  Skin:    General: Skin is warm and dry.  Neurological:     General: No focal deficit present.     Mental Status: She is alert and oriented to person, place, and time.  Psychiatric:        Mood and Affect: Mood normal.        Behavior: Behavior normal.      UC Treatments / Results  Labs (all labs ordered are listed, but only abnormal results are displayed) Labs Reviewed - No data to display  EKG   Radiology No results found.  Procedures Procedures (including critical care time)  Medications Ordered in UC Medications - No data to display  Initial Impression / Assessment and Plan / UC Course  I have reviewed the triage vital signs and the nursing notes.  Pertinent labs & imaging results that were available during my care of the patient were reviewed by me and considered in my medical decision making (see chart for details).  Patient presents for complaints of sinus symptoms that been present for the past month.  On exam, patient has moderate maxillary sinus tenderness.  She also has swelling of her bilateral upper and lower eyelids.  We will start patient on Augmentin given the duration of her symptoms and her current presentation today.  Patient was advised to continue use of the normal saline nasal spray to help with nasal congestion and to also continue Flonase.  Supportive care recommendations were provided to the patient.  Patient was  advised to follow-up with Dr. Ihor Dow office to discuss the results of her CT scan.  Patient verbalizes understanding.  All questions were answered. Final Clinical Impressions(s) / UC Diagnoses   Final diagnoses:  Acute sinusitis, recurrence not specified, unspecified location     Discharge Instructions      Take medication as directed. Increase fluids and get plenty of rest. May take over-the-counter ibuprofen or Tylenol as needed for pain, fever, or general discomfort. Continue normal saline nasal spray to help with nasal congestion throughout the day. For your cough, it may be helpful to use a humidifier at bedtime during sleep. As discussed, please contact Dr. Deeann Saint office for an evaluation this week to review your CT scan.     ED Prescriptions     Medication Sig Dispense Auth. Provider   amoxicillin-clavulanate (AUGMENTIN) 875-125 MG tablet Take 1 tablet by mouth every 12 (twelve) hours. 14 tablet Isaias Dowson-Warren, Alda Lea, NP   brompheniramine-pseudoephedrine-DM 30-2-10 MG/5ML syrup Take 5 mLs by mouth 4 (four) times daily as needed. 140 mL Gerhard Rappaport-Warren, Alda Lea, NP      PDMP not reviewed this encounter.   Tish Men, NP 12/07/21 1637

## 2021-12-10 DIAGNOSIS — J310 Chronic rhinitis: Secondary | ICD-10-CM | POA: Diagnosis not present

## 2021-12-10 DIAGNOSIS — J343 Hypertrophy of nasal turbinates: Secondary | ICD-10-CM | POA: Diagnosis not present

## 2021-12-10 DIAGNOSIS — J342 Deviated nasal septum: Secondary | ICD-10-CM | POA: Diagnosis not present

## 2021-12-10 DIAGNOSIS — J324 Chronic pansinusitis: Secondary | ICD-10-CM | POA: Diagnosis not present

## 2022-02-25 DIAGNOSIS — R634 Abnormal weight loss: Secondary | ICD-10-CM | POA: Diagnosis not present

## 2022-02-25 DIAGNOSIS — D696 Thrombocytopenia, unspecified: Secondary | ICD-10-CM | POA: Diagnosis not present

## 2022-02-25 DIAGNOSIS — R5383 Other fatigue: Secondary | ICD-10-CM | POA: Diagnosis not present

## 2022-02-25 DIAGNOSIS — R233 Spontaneous ecchymoses: Secondary | ICD-10-CM | POA: Diagnosis not present

## 2022-02-25 DIAGNOSIS — D7589 Other specified diseases of blood and blood-forming organs: Secondary | ICD-10-CM | POA: Diagnosis not present

## 2022-02-26 DIAGNOSIS — M19011 Primary osteoarthritis, right shoulder: Secondary | ICD-10-CM | POA: Diagnosis not present

## 2022-03-05 ENCOUNTER — Other Ambulatory Visit (HOSPITAL_COMMUNITY): Payer: Self-pay | Admitting: Internal Medicine

## 2022-03-05 DIAGNOSIS — R7989 Other specified abnormal findings of blood chemistry: Secondary | ICD-10-CM

## 2022-03-05 DIAGNOSIS — D696 Thrombocytopenia, unspecified: Secondary | ICD-10-CM | POA: Diagnosis not present

## 2022-03-05 DIAGNOSIS — E441 Mild protein-calorie malnutrition: Secondary | ICD-10-CM | POA: Diagnosis not present

## 2022-03-05 DIAGNOSIS — R634 Abnormal weight loss: Secondary | ICD-10-CM

## 2022-03-05 DIAGNOSIS — R7401 Elevation of levels of liver transaminase levels: Secondary | ICD-10-CM | POA: Diagnosis not present

## 2022-03-05 DIAGNOSIS — Z23 Encounter for immunization: Secondary | ICD-10-CM | POA: Diagnosis not present

## 2022-03-14 ENCOUNTER — Other Ambulatory Visit (HOSPITAL_COMMUNITY): Payer: Medicare Other

## 2022-03-19 ENCOUNTER — Ambulatory Visit (HOSPITAL_COMMUNITY)
Admission: RE | Admit: 2022-03-19 | Discharge: 2022-03-19 | Disposition: A | Discharge status: Home / Self Care | Payer: Medicare Other | Source: Ambulatory Visit | Attending: Internal Medicine | Admitting: Internal Medicine

## 2022-03-19 DIAGNOSIS — N281 Cyst of kidney, acquired: Secondary | ICD-10-CM | POA: Diagnosis not present

## 2022-03-19 DIAGNOSIS — R7989 Other specified abnormal findings of blood chemistry: Secondary | ICD-10-CM

## 2022-03-19 DIAGNOSIS — K760 Fatty (change of) liver, not elsewhere classified: Secondary | ICD-10-CM | POA: Diagnosis not present

## 2022-03-19 DIAGNOSIS — R634 Abnormal weight loss: Secondary | ICD-10-CM

## 2022-03-19 MED ORDER — IOHEXOL 300 MG/ML  SOLN
80.0000 mL | Freq: Once | INTRAMUSCULAR | Status: AC | PRN
  Administered 2022-03-19: 80 mL via INTRAVENOUS

## 2022-03-25 ENCOUNTER — Encounter: Payer: Self-pay | Admitting: Internal Medicine

## 2022-03-28 ENCOUNTER — Telehealth: Payer: Self-pay | Admitting: *Deleted

## 2022-03-28 NOTE — Telephone Encounter (Signed)
Pt's husband called and states pt is weak and can't eat. She has swelling  and red rash on lower extremities. Legs have been swollen for a couple weeks now, but rash just developed this week. Please advise.

## 2022-03-29 ENCOUNTER — Other Ambulatory Visit: Payer: Self-pay

## 2022-03-29 ENCOUNTER — Emergency Department (HOSPITAL_COMMUNITY)
Admission: EM | Admit: 2022-03-29 | Discharge: 2022-03-29 | Disposition: A | Discharge status: Home / Self Care | Payer: Medicare Other | Attending: Emergency Medicine | Admitting: Emergency Medicine

## 2022-03-29 ENCOUNTER — Encounter (HOSPITAL_COMMUNITY): Payer: Self-pay | Admitting: *Deleted

## 2022-03-29 DIAGNOSIS — I10 Essential (primary) hypertension: Secondary | ICD-10-CM | POA: Diagnosis not present

## 2022-03-29 DIAGNOSIS — R7989 Other specified abnormal findings of blood chemistry: Secondary | ICD-10-CM | POA: Insufficient documentation

## 2022-03-29 DIAGNOSIS — W19XXXA Unspecified fall, initial encounter: Secondary | ICD-10-CM | POA: Diagnosis not present

## 2022-03-29 DIAGNOSIS — R7401 Elevation of levels of liver transaminase levels: Secondary | ICD-10-CM | POA: Diagnosis not present

## 2022-03-29 DIAGNOSIS — R188 Other ascites: Secondary | ICD-10-CM | POA: Diagnosis not present

## 2022-03-29 DIAGNOSIS — R600 Localized edema: Secondary | ICD-10-CM | POA: Diagnosis not present

## 2022-03-29 DIAGNOSIS — K7460 Unspecified cirrhosis of liver: Secondary | ICD-10-CM | POA: Diagnosis not present

## 2022-03-29 DIAGNOSIS — R112 Nausea with vomiting, unspecified: Secondary | ICD-10-CM | POA: Insufficient documentation

## 2022-03-29 DIAGNOSIS — R609 Edema, unspecified: Secondary | ICD-10-CM | POA: Diagnosis not present

## 2022-03-29 DIAGNOSIS — R2243 Localized swelling, mass and lump, lower limb, bilateral: Secondary | ICD-10-CM | POA: Diagnosis not present

## 2022-03-29 LAB — MAGNESIUM: Magnesium: 1.6 mg/dL — ABNORMAL LOW (ref 1.7–2.4)

## 2022-03-29 LAB — COMPREHENSIVE METABOLIC PANEL
ALT: 18 U/L (ref 0–44)
AST: 50 U/L — ABNORMAL HIGH (ref 15–41)
Albumin: 2.8 g/dL — ABNORMAL LOW (ref 3.5–5.0)
Alkaline Phosphatase: 137 U/L — ABNORMAL HIGH (ref 38–126)
Anion gap: 7 (ref 5–15)
BUN: 6 mg/dL — ABNORMAL LOW (ref 8–23)
CO2: 24 mmol/L (ref 22–32)
Calcium: 8.1 mg/dL — ABNORMAL LOW (ref 8.9–10.3)
Chloride: 101 mmol/L (ref 98–111)
Creatinine, Ser: 0.71 mg/dL (ref 0.44–1.00)
GFR, Estimated: >60 mL/min (ref >60)
Glucose, Bld: 110 mg/dL — ABNORMAL HIGH (ref 70–99)
Potassium: 4.6 mmol/L (ref 3.5–5.1)
Sodium: 132 mmol/L — ABNORMAL LOW (ref 135–145)
Total Bilirubin: 1.7 mg/dL — ABNORMAL HIGH (ref 0.3–1.2)
Total Protein: 5.3 g/dL — ABNORMAL LOW (ref 6.5–8.1)

## 2022-03-29 LAB — CBC WITH DIFFERENTIAL/PLATELET
Abs Immature Granulocytes: 0.01 10*3/uL (ref 0.00–0.07)
Basophils Absolute: 0 10*3/uL (ref 0.0–0.1)
Basophils Relative: 1 %
Eosinophils Absolute: 0.2 10*3/uL (ref 0.0–0.5)
Eosinophils Relative: 4 %
HCT: 33.5 % — ABNORMAL LOW (ref 36.0–46.0)
Hemoglobin: 11.3 g/dL — ABNORMAL LOW (ref 12.0–15.0)
Immature Granulocytes: 0 %
Lymphocytes Relative: 21 %
Lymphs Abs: 1 10*3/uL (ref 0.7–4.0)
MCH: 37.7 pg — ABNORMAL HIGH (ref 26.0–34.0)
MCHC: 33.7 g/dL (ref 30.0–36.0)
MCV: 111.7 fL — ABNORMAL HIGH (ref 80.0–100.0)
Monocytes Absolute: 0.5 10*3/uL (ref 0.1–1.0)
Monocytes Relative: 9 %
Neutro Abs: 3.2 10*3/uL (ref 1.7–7.7)
Neutrophils Relative %: 65 %
Platelets: 97 10*3/uL — ABNORMAL LOW (ref 150–400)
RBC: 3 MIL/uL — ABNORMAL LOW (ref 3.87–5.11)
RDW: 12.6 % (ref 11.5–15.5)
WBC: 4.9 10*3/uL (ref 4.0–10.5)
nRBC: 0 % (ref 0.0–0.2)

## 2022-03-29 LAB — AMMONIA: Ammonia: 25 umol/L (ref 9–35)

## 2022-03-29 LAB — TROPONIN I (HIGH SENSITIVITY): Troponin I (High Sensitivity): 4 ng/L (ref <18)

## 2022-03-29 LAB — BRAIN NATRIURETIC PEPTIDE: B Natriuretic Peptide: 236 pg/mL — ABNORMAL HIGH (ref 0.0–100.0)

## 2022-03-29 LAB — PROTIME-INR
INR: 1.3 — ABNORMAL HIGH (ref 0.8–1.2)
Prothrombin Time: 15.7 seconds — ABNORMAL HIGH (ref 11.4–15.2)

## 2022-03-29 LAB — LIPASE, BLOOD: Lipase: 56 U/L — ABNORMAL HIGH (ref 11–51)

## 2022-03-29 MED ORDER — FUROSEMIDE 20 MG PO TABS: 20.0000 mg | ORAL_TABLET | Freq: Every day | ORAL | 0 refills | Status: DC

## 2022-03-29 MED ORDER — LACTULOSE 10 GM/15ML PO SOLN: 10.0000 g | Freq: Every day | ORAL | 0 refills | Status: DC

## 2022-03-29 NOTE — ED Triage Notes (Signed)
Pt with bilateral leg swelling.  Pt with recent dx of liver cirrhosis. + Nausea, generalized weakness. Discoloration to right shin.  Scabbed area to left knee from a recent fall.

## 2022-03-29 NOTE — ED Provider Notes (Signed)
[facility] EMERGENCY DEPARTMENT Provider Note   CSN: [id number] Arrival date & time: 03/29/22  1426     History {Add pertinent medical, surgical, social history, OB history to HPI:1} Chief Complaint  Patient presents with   Leg Swelling    Nicole Barrett is a 76 y.o. female.  She is brought in by her son for evaluation of worsening of lower extremity edema over the course of 3 weeks.  He said she has been generally fatigued having more difficulty ambulating and has had a couple of falls.  Legs have been getting more swollen and red.  She has a new diagnosis of cirrhosis by CT done a few weeks ago and is waiting to follow-up with GI for further evaluation.  She said she drinks 3 glasses of wine up until about a month ago and is now cut back down to 0 or 1 glass.  She has poor intake and sometimes vomits, nonbloody.  She denies any withdrawal symptoms.  Son also says she has been more forgetful  The history is provided by the patient.  Weakness Severity:  Moderate Onset quality:  Gradual Duration:  4 weeks Timing:  Constant Progression:  Worsening Chronicity:  New Relieved by:  Nothing Worsened by:  Activity Ineffective treatments:  None tried Associated symptoms: difficulty walking, falls, nausea and vomiting   Associated symptoms: no abdominal pain, no chest pain, no cough, no diarrhea, no dysuria, no fever, no frequency, no headaches, no loss of consciousness, no shortness of breath and no vision change        Home Medications Prior to Admission medications   Medication Sig Start Date End Date Taking? Authorizing Provider  Apoaequorin (PREVAGEN) 10 MG CAPS Take by mouth.    [provider]  clonazePAM (KLONOPIN) 0.5 MG tablet TK 1/2 TO 1 T PO BID PRN 11/30/18   [provider]  diclofenac Sodium (VOLTAREN) 1 % GEL Apply topically as needed.    [provider]  fexofenadine (ALLEGRA) 180 MG tablet Take 180 mg by mouth daily.    [provider]  Multiple Vitamins-Minerals (PRESERVISION AREDS 2 PO) Take 1 capsule by mouth daily.     [provider]  pantoprazole (PROTONIX) 40 MG tablet Take 1 tablet (40 mg total) by mouth 2 (two) times daily. 10/08/21   Sherron Monday, NP      Allergies    Codeine    Review of Systems   Review of Systems  Constitutional:  Negative for fever.  Eyes:  Negative for visual disturbance.  Respiratory:  Negative for cough and shortness of breath.   Cardiovascular:  Positive for leg swelling. Negative for chest pain.  Gastrointestinal:  Positive for nausea and vomiting. Negative for abdominal pain and diarrhea.  Genitourinary:  Negative for dysuria and frequency.  Musculoskeletal:  Positive for falls and gait problem.  Skin:  Positive for wound.  Neurological:  Positive for weakness. Negative for loss of consciousness and headaches.    Physical Exam Updated Vital Signs BP 131/70 (BP Location: Right Arm)   Pulse 83   Temp 98.3 F (36.8 C) (Oral)   Resp 17   Ht '5\' 5"'$  (1.651 m)   Wt 68 kg   SpO2 98%   BMI 24.96 kg/m  Physical Exam Vitals and nursing note reviewed.  Constitutional:      General: She is not in acute distress.    Appearance: Normal appearance. She is well-developed.  HENT:     Head: Normocephalic and  atraumatic.  Eyes:     Conjunctiva/sclera: Conjunctivae normal.  Cardiovascular:     Rate and Rhythm: Normal rate and regular rhythm.     Heart sounds: No murmur heard. Pulmonary:     Effort: Pulmonary effort is normal. No respiratory distress.     Breath sounds: Normal breath sounds.  Abdominal:     Palpations: Abdomen is soft.     Tenderness: There is no abdominal tenderness. There is no guarding or rebound.  Musculoskeletal:        General: Signs of injury present.     Cervical back: Neck supple.     Right lower leg: Edema present.     Left lower leg: Edema present.     Comments: She has symmetric pitting edema to about her knees.  She has a  couple lower extremity wounds and some venous stasis changes.  Skin:    General: Skin is warm and dry.     Capillary Refill: Capillary refill takes less than 2 seconds.  Neurological:     General: No focal deficit present.     Mental Status: She is alert.     Sensory: No sensory deficit.     Motor: No weakness.     ED Results / Procedures / Treatments   Labs (all labs ordered are listed, but only abnormal results are displayed) Labs Reviewed - No data to display  EKG None  Radiology No results found.  Procedures Procedures  {Document cardiac monitor, telemetry assessment procedure when appropriate:1}  Medications Ordered in ED Medications - No data to display  ED Course/ Medical Decision Making/ A&P                           Medical Decision Making Amount and/or Complexity of Data Reviewed Labs: ordered.   This patient complains of ***; this involves an extensive number of treatment Options and is a complaint that carries with it a high risk of complications and morbidity. The differential includes ***  I ordered, reviewed and interpreted labs, which included *** I ordered medication *** and reviewed PMP when indicated. I ordered imaging studies which included *** and I independently    visualized and interpreted imaging which showed *** Additional history obtained from *** Previous records obtained and reviewed *** I consulted *** and discussed lab and imaging findings and discussed disposition.  Cardiac monitoring reviewed, *** Social determinants considered, *** Critical Interventions: ***  After the interventions stated above, I reevaluated the patient and found *** Admission and further testing considered, ***   {Document critical care time when appropriate:1} {Document review of labs and clinical decision tools ie heart score, Chads2Vasc2 etc:1}  {Document your independent review of radiology images, and any outside records:1} {Document your  discussion with family members, caretakers, and with consultants:1} {Document social determinants of health affecting pt's care:1} {Document your decision making why or why not admission, treatments were needed:1} Final Clinical Impression(s) / ED Diagnoses Final diagnoses:  None    Rx / DC Orders ED Discharge Orders     None

## 2022-03-29 NOTE — Discharge Instructions (Addendum)
You were seen in the emergency department for worsening swelling of your legs and poor appetite.  Dr. Gala Romney is recommending starting you on a low sodium diet 2 g/day.  We are also starting you on a fluid pill and some medication to cause more frequent bowel movements.  His office should call you next week for close follow-up.  Please also follow-up with Dr. Willey Blade.  Return to the emergency department if any worsening or concerning symptoms

## 2022-03-31 ENCOUNTER — Telehealth: Payer: Self-pay

## 2022-03-31 NOTE — Telephone Encounter (Signed)
Routing to the front to get f/u appt scheduled.

## 2022-03-31 NOTE — Telephone Encounter (Signed)
-----   Message from Daneil Dolin, MD sent at 03/29/2022  4:47 PM EST -----   This lady is known to our practice.  Appointment in January.  Recent CT by PCP shows cirrhosis.  In ED this evening with some anasarca and subtle mental status changes.  Labs look good per EDP.   Dr. Melina Copa did not feel that  she needed to be admitted.  Recommendations on low-dose Lasix, salt restriction and beginning lactulose provided.  She needs to come in the office really in the next week to 10 days if we could facilitate that would be great.    There is an ongoing EtOH history

## 2022-03-31 NOTE — Telephone Encounter (Signed)
Noted  

## 2022-04-12 NOTE — Progress Notes (Signed)
Working up peripheral edema. CHF is in differential.   Nicole Barrett

## 2022-04-16 NOTE — Progress Notes (Unsigned)
GI Office Note    Referring Provider: Asencion Noble, MD Primary Care Physician:  Asencion Noble, MD Primary Gastroenterologist: Cristopher Estimable.Rourk, MD  Date:  04/17/2022  ID:  Nicole Barrett, DOB 06-14-1945, MRN [id number]   Chief Complaint   Chief Complaint  Patient presents with   Leg Swelling    Legs and feet are swelling     History of Present Illness  Nicole Barrett is a 76 y.o. female with a history of hiatal hernia, GERD, anxiety, macular degeneration presenting today for evaluation of cirrhosis.  EGD December 2015: -noncritical Schatzki's ring -small hiatal hernia -abnormal gastric mucosa with gastric polyps (benign biopsy)   Colonoscopy December 2015: -diverticulosis -tubular adenomas removed -random colon biopsies negative  -Repeat in 7 years  Last seen via virtual visit 10/08/21 for GERD and to receive refills. Has breakthorugh symptoms if she forgets to take medication in the evening. Taking PPI BID. Primarily eats larger meal at lunch and light dinner. Raising 12yo grandsons. Denied dysphagia. Did report lack of appetite however attributes to anxiety. Denied melena, brbpr, change in bowel habits. Weight stable. No abdominal pain. Pantoprazole refilled. GERD diet reinforced. Offered TCS however patient declined. Cologuard offered and declined.   Husband called in 03/28/22 reporting rash to legs and swelling. Also with lack of appetite. Advised ED or urgent care visit given concern for possible cellulitis.   PCP recently performed CT A/P 11/29 noting hepatic steatosis and capsular nodularity consistent with cirrhosis. Prior cholecystectomy. Normal pancreas. Splenomegaly present (portal hypertension). Diffuse gastric and small bowel wall thickening and mesenteric edema consistent with portal gastropathy. Sigmoid diverticulosis. Mild to moderate ascites and diffuse mesenteric and body wall edema.   Had ED visit 03/29/22. GI consulted with in ED and Dr. Gala Romney recommended low  sodium diet, low dose lasix and to begin lactulose. Labs: Na 132, albumin 2.8, cr 0.71, T Bili 1.7, AST 50, AT 18, Alk phos 137, Hgb 11.3, MCV 117, plts 97, ammonia 25, INR 1.3, BNP 236  Today: Cirrhosis history: Hematemesis/coffee ground emesis: None History of variceal bleeding: None Abdominal pain: None Abdominal distention/worsening ascites: Some lower stomach edema.  Fever/chills: None, staying cold more than normal Episodes of confusion/disorientation: None, some issues with balance.  Number of daily bowel movements: BM 2-3 per day, not taking lactulose currently. (Eats a lot of fiber and greens Taking diuretics?: lasix 20 mg daily.  LE edema: Worsening LE edema.  Date of last EGD: December 2015 Prior history of banding?: N/A Prior episodes of SBP: no Last time liver imaging was performed: 03/19/22 NSAIDs: takes Advil PM Alcohol use: 2 glasses of wine daily  MELD Na score: 11 on 04/08/22.  Appetite has been better since ED visit. Watching salt intake. Has a lot of pain with standing and walking due to the pain. No N/V. Takes 1/2 klonopin at night a long with Advil PM. Takes along time to get herself to sleep. Weighing herself not often. Weight used to be 138-142. Likely mostly fluid. No shortness of breath.   Current Outpatient Medications  Medication Sig Dispense Refill   Apoaequorin (PREVAGEN) 10 MG CAPS Take 1 capsule by mouth daily.     clonazePAM (KLONOPIN) 0.5 MG tablet Take 0.25 mg by mouth daily.     diclofenac Sodium (VOLTAREN) 1 % GEL Apply 2 g topically daily as needed (arthritis pain).     fexofenadine (ALLEGRA) 180 MG tablet Take 180 mg by mouth daily as needed for allergies (Spring/Fall).     furosemide (LASIX)  20 MG tablet Take 1 tablet (20 mg total) by mouth daily. 30 tablet 0   pantoprazole (PROTONIX) 40 MG tablet Take 1 tablet (40 mg total) by mouth 2 (two) times daily. 180 tablet 3   fluticasone (FLONASE) 50 MCG/ACT nasal spray Place 2 sprays into both  nostrils daily as needed for allergies. (Patient not taking: Reported on 04/17/2022)     ipratropium (ATROVENT) 0.06 % nasal spray Place 1 spray into both nostrils daily as needed for rhinitis. (Patient not taking: Reported on 04/17/2022)     lactulose (CHRONULAC) 10 GM/15ML solution Take 15 mLs (10 g total) by mouth daily. (Patient not taking: Reported on 04/17/2022) 946 mL 0   No current facility-administered medications for this visit.    Past Medical History:  Diagnosis Date   Anxiety    GERD (gastroesophageal reflux disease)    History of hiatal hernia    HOH (hard of hearing)    Macular degeneration     Past Surgical History:  Procedure Laterality Date   CATARACT EXTRACTION EXTRACAPSULAR Right 05/24/2021   Procedure: CATARACT EXTRACTION PHACO AND INTRAOCULAR LENS PLACEMENT ([place]);  Surgeon: Baruch Goldmann, MD;  Location: [facility];  Service: Ophthalmology;  Laterality: Right;  CDE 12.48   CATARACT EXTRACTION W/PHACO Left 06/07/2021   Procedure: CATARACT EXTRACTION PHACO AND INTRAOCULAR LENS PLACEMENT ([facility]);  Surgeon: Baruch Goldmann, MD;  Location: [facility];  Service: Ophthalmology;  Laterality: Left;  CDE: 5.36   CHOLECYSTECTOMY  2011   Dr. Geroge Baseman   COLONOSCOPY N/A 04/05/2014   JOA:[id number] diverticulosis,colonic polyps removed (TA), random colon biopsies negative. next TCS 03/2021   cyst removed from right hand     ESOPHAGOGASTRODUODENOSCOPY N/A 04/05/2014   KZS:[id number] schazkis ring/HH, abnormal gastric mucosa with gastric polyps (benign)    Family History  Problem Relation Age of Onset   Pancreatic cancer Mother    Diabetes Other    Heart disease Other    Colon cancer Neg Hx     Allergies as of 04/17/2022 - Review Complete 04/17/2022  Allergen Reaction Noted   Codeine Nausea And Vomiting 03/14/2014    Social History   Socioeconomic History   Marital status: Married    Spouse name: Not on file   Number of children: 1   Years of education: Not on file   Highest  education level: Not on file  Occupational History   Not on file  Tobacco Use   Smoking status: Former    Packs/day: 1.50    Years: 20.00    Total pack years: 30.00    Types: Cigarettes   Smokeless tobacco: Never   Tobacco comments:    Quit smokking 3 yrs ago  Substance and Sexual Activity   Alcohol use: Yes    Alcohol/week: 14.0 standard drinks of alcohol    Types: 14 Glasses of wine per week    Comment: 2 glasses of wine daily   Drug use: No   Sexual activity: Not on file  Other Topics Concern   Not on file  Social History Narrative   Not on file   Social Determinants of Health   Financial Resource Strain: Not on file  Food Insecurity: Not on file  Transportation Needs: Not on file  Physical Activity: Not on file  Stress: Not on file  Social Connections: Not on file     Review of Systems   Gen: + fatigue, weakness. Denies fever, chills, anorexia. Denies weight loss.  CV: + peripheral edema. Denies chest pain, palpitations, syncope,and claudication.  Resp: Denies dyspnea at rest, cough, wheezing, coughing up blood, and pleurisy. GI: See HPI Derm: Denies rash, itching, dry skin Psych: Denies depression, anxiety, memory loss, confusion. No homicidal or suicidal ideation.  Heme: Denies bruising, bleeding, and enlarged lymph nodes.   Physical Exam   BP 134/78 (BP Location: Right Arm, Patient Position: Sitting, Cuff Size: Normal)   Pulse 90   Temp 98.1 F (36.7 C) (Temporal)   Ht _0  (1.651 m)   Wt 168 lb 3.2 oz (76.3 kg)   SpO2 98%   BMI 27.99 kg/m   General:   Alert and oriented. No distress noted. Pleasant and cooperative.  Head:  Normocephalic and atraumatic. Eyes:  Conjuctiva clear without scleral icterus. Mouth:  Oral mucosa pink and moist. Good dentition. No lesions. Lungs:  Clear to auscultation bilaterally. No wheezes, rales, or rhonchi. No distress.  Heart:  S1, S2 present without murmurs appreciated.  Abdomen:  +BS, soft, nontender.  Mild  distention likely related to small amount of ascites.  No abdominal wall edema.  No rebound or guarding. No HSM or masses noted. Rectal: deferred Msk:  Symmetrical without gross deformities. Normal posture. Extremities: +1/+2 pitting edema from top of feet up to most proximal thigh.  No edema to hips.  Mild erythema, appears to have been improved from prior per report. Neurologic:  Alert and  oriented x4 Psych:  Alert and cooperative. Normal mood and affect.   Assessment  Nicole Barrett is a 76 y.o. female with a history of hiatal hernia, GERD, anxiety, macular degeneration presenting today for evaluation of newly diagnosed cirrhosis.  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 Recent imaging with nodular liver and evidence of portal hypertension. Recent ED visit 03/29/22 with anasarca and reported N/V and poor appetite. Labs with evidence of thrombocytopenia, elevated INR, elevated LFTs and bilirubin. Ammonia wnl. Started on low dose lasix 20 mg daily and lactulose 10g daily.  Not currently taking lactulose given she is having 2-3 soft bowel movements daily without it.  Denies any jaundice, pruritus, confusion, bleeding episodes.  Continues to have fatigue.  Has stopped drinking alcohol since her diagnosis.  Has been taking Lasix as prescribed but continues to have swelling.  Is slightly improved in the mornings but gets worse throughout the day and is causing pain and balance issues.  Advised to take Lasix 40 mg for the next 2 days and then resume back to 20 mg daily and we will add spironolactone 25 mg daily.  Will slowly increase diuretics to therapeutic dosing.  Will check potassium and kidney function in 1 week and make additional adjustments as necessary.  She will follow-up in 4 weeks or sooner if no improvement or significant ascites develops.  We discussed potential paracentesis if she began having shortness of breath, increased abdominal  girth, or abdominal pain.  Separate written education provided regarding cirrhosis including diet provided.  Education on hypokalemia also provided given she is on Lasix.  GERD: Controlled on PPI.   PLAN    Repeat CBC and CMP; ANA, AMA, ASMA, hepatitis panel, AFP Hold lactulose for now, please restart if you begin to have any confusion or have less than 2 semiformed bowel movements per day. Continue pantoprazole 40 mg daily. Continue lasix 20 mg daily, will briefly increase to 40 mg daily for the next 2 days. Start spironolactone 25 mg daily BMP in 1 week. High-protein diet from a primarily plant-based diet. Avoid red meat.  No raw or undercooked meat, seafood, or shellfish. Low-fat/cholesterol/carbohydrate diet. Limit sodium to no more than 2000 mg/day including everything that you eat and drink. Limit Tylenol to no more than 2000 mg/day. Recommend at least 30 minutes of aerobic and resistance exercise 3 days/week. F/u in 4 weeks   [name], MSN, FNP-BC, AGACNP-BC [facility]

## 2022-04-17 ENCOUNTER — Encounter: Payer: Self-pay | Admitting: Gastroenterology

## 2022-04-17 ENCOUNTER — Ambulatory Visit (INDEPENDENT_AMBULATORY_CARE_PROVIDER_SITE_OTHER): Payer: Medicare Other | Admitting: Gastroenterology

## 2022-04-17 VITALS — BP 134/78 | HR 90 | Temp 98.1°F | Ht 65.0 in | Wt 168.2 lb

## 2022-04-17 DIAGNOSIS — K219 Gastro-esophageal reflux disease without esophagitis: Secondary | ICD-10-CM | POA: Diagnosis not present

## 2022-04-17 DIAGNOSIS — R188 Other ascites: Secondary | ICD-10-CM

## 2022-04-17 DIAGNOSIS — K7460 Unspecified cirrhosis of liver: Secondary | ICD-10-CM | POA: Diagnosis not present

## 2022-04-17 MED ORDER — SPIRONOLACTONE 25 MG PO TABS: 25.0000 mg | ORAL_TABLET | Freq: Every day | ORAL | 2 refills | Status: DC

## 2022-04-17 NOTE — Patient Instructions (Addendum)
I want you to briefly increase your Lasix to 40 mg daily for the next 2 days then resume back to 1 tablet (20 mg) daily.  I am sending in spironolactone for you to take 25 mg daily.  Please take your diuretics in the morning to prevent frequent trips to the bathroom overnight.  It is important that you have a BMP checked in 1 week for me to check your potassium and your kidney function.  At that time I am also going to have your blood counts rechecked as well as a basic workup of causes of cirrhosis.  I am attaching some education regarding low potassium for you to review.  I am providing some education on cirrhosis.  You had some recent imaging done therefore you are good for the next 6 months.  With cirrhosis this puts you at a higher risk for hepatocellular carcinoma therefore we performed screening every 6 months.  Nutrition:  High-protein diet from a primarily plant-based diet. Avoid red meat.  No raw or undercooked meat, seafood, or shellfish. Low-fat/cholesterol/carbohydrate diet.  Diet low in added sugars. Limit sodium to no more than 2000 mg/day including everything that you eat and drink. Recommend at least 30 minutes of aerobic and resistance exercise 3 days/week. Limit Tylenol to 2000 mg/day. Avoid any alcohol  Keep lactulose 10 g daily on hand.  If you begin having less than 2 soft semiformed bowel movements per day I want you to take lactulose 10 g. Continue pantoprazole 40 mg daily.  Will see for follow-up in 4 weeks.  Will be in touch with lab results after I have received them with any further recommendations.  It was a pleasure to see you today. I want to create trusting relationships with patients. If you receive a survey regarding your visit,  I greatly appreciate you taking time to fill this out on paper or through your MyChart. I value your feedback.  [name], MSN, FNP-BC, AGACNP-BC [facility]

## 2022-04-22 ENCOUNTER — Other Ambulatory Visit: Payer: Self-pay | Admitting: *Deleted

## 2022-04-22 ENCOUNTER — Telehealth: Payer: Self-pay | Admitting: *Deleted

## 2022-04-22 DIAGNOSIS — D649 Anemia, unspecified: Secondary | ICD-10-CM

## 2022-04-22 DIAGNOSIS — K7460 Unspecified cirrhosis of liver: Secondary | ICD-10-CM

## 2022-04-22 NOTE — Telephone Encounter (Signed)
Spoke to pt's husband Marden Noble [facility]) informed him of recommendations. He voiced understanding. He states pt is drink plenty of water. I informed him to have labs completed as soon as possible. Labs entered into Epic.

## 2022-04-22 NOTE — Telephone Encounter (Signed)
Pt's husband Marden Noble [employer]) called and states patient is having cramping in her hands. He would like to know if there is a supplement that is safe for her to take? Would like to know if she can take potassium? Also wants to know if it is safe to take lactulose and spironolactone at the same time. Please advise.

## 2022-04-28 ENCOUNTER — Other Ambulatory Visit: Payer: Self-pay | Admitting: *Deleted

## 2022-04-28 DIAGNOSIS — D649 Anemia, unspecified: Secondary | ICD-10-CM

## 2022-04-28 DIAGNOSIS — K7460 Unspecified cirrhosis of liver: Secondary | ICD-10-CM

## 2022-04-30 DIAGNOSIS — K7460 Unspecified cirrhosis of liver: Secondary | ICD-10-CM | POA: Diagnosis not present

## 2022-04-30 DIAGNOSIS — R188 Other ascites: Secondary | ICD-10-CM | POA: Diagnosis not present

## 2022-04-30 DIAGNOSIS — D649 Anemia, unspecified: Secondary | ICD-10-CM | POA: Diagnosis not present

## 2022-05-01 ENCOUNTER — Other Ambulatory Visit: Payer: Self-pay | Admitting: *Deleted

## 2022-05-01 DIAGNOSIS — R188 Other ascites: Secondary | ICD-10-CM

## 2022-05-01 LAB — MAGNESIUM: Magnesium: 1.7 mg/dL (ref 1.5–2.5)

## 2022-05-02 ENCOUNTER — Ambulatory Visit: Payer: Medicare Other | Admitting: Internal Medicine

## 2022-05-02 DIAGNOSIS — R188 Other ascites: Secondary | ICD-10-CM | POA: Diagnosis not present

## 2022-05-02 DIAGNOSIS — K7460 Unspecified cirrhosis of liver: Secondary | ICD-10-CM | POA: Diagnosis not present

## 2022-05-07 LAB — HEPATITIS C ANTIBODY: Hepatitis C Ab: NONREACTIVE

## 2022-05-07 LAB — PROTIME-INR
INR: 1.2 — ABNORMAL HIGH
Prothrombin Time: 13 s — ABNORMAL HIGH (ref 9.0–11.5)

## 2022-05-07 LAB — HEPATITIS B SURFACE ANTIBODY,QUALITATIVE: Hep B S Ab: NONREACTIVE

## 2022-05-07 LAB — BASIC METABOLIC PANEL
BUN: 8 mg/dL (ref 7–25)
CO2: 25 mmol/L (ref 20–32)
Calcium: 8.8 mg/dL (ref 8.6–10.4)
Chloride: 105 mmol/L (ref 98–110)
Creat: 0.65 mg/dL (ref 0.60–1.00)
Glucose, Bld: 91 mg/dL (ref 65–99)
Potassium: 4.1 mmol/L (ref 3.5–5.3)
Sodium: 140 mmol/L (ref 135–146)

## 2022-05-07 LAB — HEPATITIS A ANTIBODY, TOTAL: Hepatitis A AB,Total: NONREACTIVE

## 2022-05-07 LAB — CBC
HCT: 31.1 % — ABNORMAL LOW (ref 35.0–45.0)
Hemoglobin: 10.9 g/dL — ABNORMAL LOW (ref 11.7–15.5)
MCH: 36.5 pg — ABNORMAL HIGH (ref 27.0–33.0)
MCHC: 35 g/dL (ref 32.0–36.0)
MCV: 104 fL — ABNORMAL HIGH (ref 80.0–100.0)
MPV: 10.8 fL (ref 7.5–12.5)
Platelets: 108 10*3/uL — ABNORMAL LOW (ref 140–400)
RBC: 2.99 10*6/uL — ABNORMAL LOW (ref 3.80–5.10)
RDW: 11.4 % (ref 11.0–15.0)
WBC: 5.4 10*3/uL (ref 3.8–10.8)

## 2022-05-07 LAB — ANTI-SMOOTH MUSCLE ANTIBODY, IGG: Actin (Smooth Muscle) Antibody (IGG): <20 U (ref <20)

## 2022-05-07 LAB — HEPATITIS B CORE ANTIBODY, TOTAL: Hep B Core Total Ab: NONREACTIVE

## 2022-05-07 LAB — HEPATITIS B SURFACE ANTIGEN: Hepatitis B Surface Ag: NONREACTIVE

## 2022-05-07 LAB — MITOCHONDRIAL ANTIBODIES: Mitochondrial M2 Ab, IgG: <=20 U (ref <=20.0)

## 2022-05-07 LAB — ANA: Anti Nuclear Antibody (ANA): NEGATIVE

## 2022-05-07 LAB — AFP TUMOR MARKER: AFP-Tumor Marker: 9.1 ng/mL — ABNORMAL HIGH

## 2022-05-14 NOTE — Progress Notes (Unsigned)
GI Office Note    Referring Provider: Asencion Noble, MD Primary Care Physician:  Asencion Noble, MD Primary Gastroenterologist: Cristopher Estimable.Rourk, MD  Date:  05/15/2022  ID:  Nicole, Barrett Mar 10, 1946, MRN [id number]   Chief Complaint   Chief Complaint  Patient presents with   Cirrhosis    Follow up on cirrhosis. Having swelling in both legs. Reports it is a little better than it was at last visit. Has been drinking more water. Reports appetite is better.     History of Present Illness  Nicole Barrett is a 77 y.o. female with a history of hiatal hernia, GERD, anxiety, macular degeneration, cirrhosis presenting today for follow up of edema  EGD December 2015: -noncritical Schatzki's ring -small hiatal hernia -abnormal gastric mucosa with gastric polyps (benign biopsy)    Colonoscopy December 2015: -diverticulosis -tubular adenomas removed -random colon biopsies negative  -Repeat in 7 years  Patient offered colonoscopy during virtual visit in June however patient declined. Pantoprazole BID refilled for GERD. Cologuard offered and declined.   PCP recently performed CT A/P 11/29 noting hepatic steatosis and capsular nodularity consistent with cirrhosis. Prior cholecystectomy. Normal pancreas. Splenomegaly present (portal hypertension). Diffuse gastric and small bowel wall thickening and mesenteric edema consistent with portal gastropathy. Sigmoid diverticulosis. Mild to moderate ascites and diffuse mesenteric and body wall edema.    Had ED visit 03/29/22. GI consulted with in ED and Dr. Gala Romney recommended low sodium diet, low dose lasix and to begin lactulose. Labs: Na 132, albumin 2.8, cr 0.71, T Bili 1.7, AST 50, AT 18, Alk phos 137, Hgb 11.3, MCV 117, plts 97, ammonia 25, INR 1.3, BNP 236  Last visit 04/17/22. Lower abdominal edema and ongoing peripheral edema. BM 2-3 per day. No fevers/chills. Lasix '20mg'$  daily. 2 glasses wine daily. Appetite improved since ED visit. Watching salt  intake. Pain with walking and standing. Taking advil PM. Trouble with insomnia. Not weighing at home. Labs ordered. Continue PPI. Continue lasix 20 mg daily, briefly increase to BID. Start spironolactone 25 mg daily. BMP in 1 week.   Labs 05/02/22: Not immune to hep A or Hep B. No active viral hepatitis. AFP elevated at 9.1. ANA, ASMA, AMA negative. BMP wnl. INR 1.2  Today: Edema: improved. Still with some to thighs and knees. Not as much redness to her lower extremities Bms: 1-2 daily. Confusion: occasional but nothing more than baseline. No disorientation.  Pruritus: none Appetite: improved  Mobility somewhat improved. Can walk short distances. Has been trying to walk 15 minutes inside her house everyday.  Husband requested to see if she could wear compression socks or a compression device.   Current Outpatient Medications  Medication Sig Dispense Refill   clonazePAM (KLONOPIN) 0.5 MG tablet Take 0.25 mg by mouth daily.     diclofenac Sodium (VOLTAREN) 1 % GEL Apply 2 g topically daily as needed (arthritis pain).     fexofenadine (ALLEGRA) 180 MG tablet Take 180 mg by mouth daily as needed for allergies (Spring/Fall).     fluticasone (FLONASE) 50 MCG/ACT nasal spray Place 2 sprays into both nostrils daily as needed for allergies.     furosemide (LASIX) 20 MG tablet Take 1 tablet (20 mg total) by mouth daily. 30 tablet 0   pantoprazole (PROTONIX) 40 MG tablet Take 1 tablet (40 mg total) by mouth 2 (two) times daily. 180 tablet 3   spironolactone (ALDACTONE) 25 MG tablet Take 1 tablet (25 mg total) by mouth daily. 30 tablet 2  Apoaequorin (PREVAGEN) 10 MG CAPS Take 1 capsule by mouth daily. (Patient not taking: Reported on 05/15/2022)     lactulose (CHRONULAC) 10 GM/15ML solution Take 15 mLs (10 g total) by mouth daily. (Patient not taking: Reported on 04/17/2022) 946 mL 0   No current facility-administered medications for this visit.    Past Medical History:  Diagnosis Date   Anxiety     GERD (gastroesophageal reflux disease)    History of hiatal hernia    HOH (hard of hearing)    Macular degeneration     Past Surgical History:  Procedure Laterality Date   CATARACT EXTRACTION EXTRACAPSULAR Right 05/24/2021   Procedure: CATARACT EXTRACTION PHACO AND INTRAOCULAR LENS PLACEMENT ([place]);  Surgeon: Baruch Goldmann, MD;  Location: [facility];  Service: Ophthalmology;  Laterality: Right;  CDE 12.48   CATARACT EXTRACTION W/PHACO Left 06/07/2021   Procedure: CATARACT EXTRACTION PHACO AND INTRAOCULAR LENS PLACEMENT ([facility]);  Surgeon: Baruch Goldmann, MD;  Location: [facility];  Service: Ophthalmology;  Laterality: Left;  CDE: 5.36   CHOLECYSTECTOMY  2011   Dr. Geroge Baseman   COLONOSCOPY N/A 04/05/2014   PIR:JJOACZY diverticulosis,colonic polyps removed (TA), random colon biopsies negative. next TCS 03/2021   cyst removed from right hand     ESOPHAGOGASTRODUODENOSCOPY N/A 04/05/2014   SAY:[id number] schazkis ring/HH, abnormal gastric mucosa with gastric polyps (benign)    Family History  Problem Relation Age of Onset   Pancreatic cancer Mother    Diabetes Other    Heart disease Other    Colon cancer Neg Hx     Allergies as of 05/15/2022 - Review Complete 04/17/2022  Allergen Reaction Noted   Codeine Nausea And Vomiting 03/14/2014    Social History   Socioeconomic History   Marital status: Married    Spouse name: Not on file   Number of children: 1   Years of education: Not on file   Highest education level: Not on file  Occupational History   Not on file  Tobacco Use   Smoking status: Former    Packs/day: 1.50    Years: 20.00    Total pack years: 30.00    Types: Cigarettes    Quit date: 16    Years since quitting: 40.0    Passive exposure: Past   Smokeless tobacco: Never  Substance and Sexual Activity   Alcohol use: Not Currently    Alcohol/week: 14.0 standard drinks of alcohol    Types: 14 Glasses of wine per week   Drug use: No   Sexual activity: Not on file  Other  Topics Concern   Not on file  Social History Narrative   Not on file   Social Determinants of Health   Financial Resource Strain: Not on file  Food Insecurity: Not on file  Transportation Needs: Not on file  Physical Activity: Not on file  Stress: Not on file  Social Connections: Not on file     Review of Systems   Gen: Denies fever, chills, anorexia. Denies fatigue, weakness, weight loss.  CV: Denies chest pain, palpitations, syncope, peripheral edema, and claudication. Resp: Denies dyspnea at rest, cough, wheezing, coughing up blood, and pleurisy. GI: See HPI Derm: Denies rash, itching, dry skin Psych: Denies depression, anxiety, memory loss, confusion. No homicidal or suicidal ideation.  Heme: Denies bruising, bleeding, and enlarged lymph nodes.   Physical Exam   BP 120/68 (BP Location: Left Arm, Patient Position: Sitting, Cuff Size: Normal)   Pulse 76   Temp 98.1 F (36.7 C) (Oral)  Ht '5\' 5"'$  (1.651 m)   Wt 159 lb 14.4 oz (72.5 kg)   BMI 26.61 kg/m   General:   Alert and oriented. No distress noted. Pleasant and cooperative.  Head:  Normocephalic and atraumatic. Eyes:  Conjuctiva clear without scleral icterus. Lungs:  Clear to auscultation bilaterally. No wheezes, rales, or rhonchi. No distress.  Heart:  S1, S2 present without murmurs appreciated.  Abdomen:  +BS, soft, non-tender and non-distended. No rebound or guarding. No HSM or masses noted. Rectal: deferred Msk:  Symmetrical without gross deformities. Normal posture. Extremities:  mild venous stasis. +1 edema to ble. +2 edema to knees.  Neurologic:  Alert and  oriented x4 Psych:  Alert and cooperative. Normal mood and affect.   Assessment  Nicole Barrett is a 77 y.o. female with a history of hiatal hernia, GERD, anxiety, macular degeneration, cirrhosis presenting today for follow up of edema  Cirrhosis with ascites: Possibly due to chronic alcohol use in the past.  Workup negative for viral hepatitis,  autoimmune serologies.  AFP elevated at 9.1.  Will continue to monitor.  MELD sodium 11 m in December.  Denies any jaundice, pruritus, confusion, bleeding.  Recent labs with mildly elevated INR and evidence of thrombocytopenia (108).  Started on spironolactone at last visit given ongoing lower extremity edema.  Repeat BMP with normal creatinine and potassium.  Continues to have some lower extremity edema although significantly improved from prior.  Will increase spironolactone to 50 mg daily continue Lasix 20 mg daily.  Will plan to recheck BMP in 2 weeks.  Advised to monitor for any signs of hypokalemia or hypomagnesemia.  Discussed continuing exercise, adhering to low-sodium diet and increasing protein intake.  She will continue daily protein supplement in the morning and daily exercise as tolerated.  Advised medical plan to repeat ultrasound and labs in May and will plan to follow-up in about 3 months.  Advised on return precautions.  Also advised may use compression socks for compression devices during the day to help with swelling.  GERD: Controlled on daily PPI.   PLAN   RUQ [place] due in May 2024 MELD labs May 2024 Alcohol cessation 2g sodium diet High protein low fat diet, avoid red meat.  Lasix 20 mg daily Increase Spironolactone to 50 mg daily.  Continue pantoprazole 40 mg daily.  Continue daily protein supplement.  Continue to avoid alcohol. May use compression devices or compression socks during the day, remove at night. Follow up in 3 months    [name], MSN, FNP-BC, AGACNP-BC [facility]

## 2022-05-15 ENCOUNTER — Other Ambulatory Visit (INDEPENDENT_AMBULATORY_CARE_PROVIDER_SITE_OTHER): Payer: Self-pay

## 2022-05-15 ENCOUNTER — Encounter: Payer: Self-pay | Admitting: Gastroenterology

## 2022-05-15 ENCOUNTER — Ambulatory Visit (INDEPENDENT_AMBULATORY_CARE_PROVIDER_SITE_OTHER): Payer: Medicare Other | Admitting: Gastroenterology

## 2022-05-15 VITALS — BP 120/68 | HR 76 | Temp 98.1°F | Ht 65.0 in | Wt 159.9 lb

## 2022-05-15 DIAGNOSIS — R188 Other ascites: Secondary | ICD-10-CM

## 2022-05-15 DIAGNOSIS — K219 Gastro-esophageal reflux disease without esophagitis: Secondary | ICD-10-CM | POA: Diagnosis not present

## 2022-05-15 DIAGNOSIS — K7460 Unspecified cirrhosis of liver: Secondary | ICD-10-CM | POA: Diagnosis not present

## 2022-05-15 NOTE — Patient Instructions (Addendum)
Nutrition:  High-protein diet from a primarily plant-based diet. Avoid red meat.  No raw or undercooked meat, seafood, or shellfish. Low-fat/cholesterol/carbohydrate diet. Limit sodium to no more than 2000 mg/day including everything that you eat and drink. Recommend at least 30 minutes of aerobic and resistance exercise 3 days/week.  Continue Lasix 20 mg daily Increase Spironolactone to 50 mg daily.   Continue pantoprazole 40 mg daily.   Continue daily protein supplement.   Follow up in 3 months or sooner if needed. Notify for any constipation, worsening swelling, and any increased confusion.   It was a pleasure to see you today. I want to create trusting relationships with patients. If you receive a survey regarding your visit,  I greatly appreciate you taking time to fill this out on paper or through your MyChart. I value your feedback.  [name], MSN, FNP-BC, AGACNP-BC [facility]

## 2022-05-23 ENCOUNTER — Emergency Department (HOSPITAL_COMMUNITY): Payer: Medicare Other

## 2022-05-23 ENCOUNTER — Other Ambulatory Visit: Payer: Self-pay

## 2022-05-23 ENCOUNTER — Emergency Department (HOSPITAL_COMMUNITY)
Admission: EM | Admit: 2022-05-23 | Discharge: 2022-05-23 | Disposition: A | Discharge status: Home / Self Care | Payer: Medicare Other | Attending: Emergency Medicine | Admitting: Emergency Medicine

## 2022-05-23 ENCOUNTER — Encounter (HOSPITAL_COMMUNITY): Payer: Self-pay | Admitting: Emergency Medicine

## 2022-05-23 DIAGNOSIS — W19XXXA Unspecified fall, initial encounter: Secondary | ICD-10-CM

## 2022-05-23 DIAGNOSIS — J181 Lobar pneumonia, unspecified organism: Secondary | ICD-10-CM | POA: Insufficient documentation

## 2022-05-23 DIAGNOSIS — J90 Pleural effusion, not elsewhere classified: Secondary | ICD-10-CM | POA: Diagnosis not present

## 2022-05-23 DIAGNOSIS — I2510 Atherosclerotic heart disease of native coronary artery without angina pectoris: Secondary | ICD-10-CM | POA: Diagnosis not present

## 2022-05-23 DIAGNOSIS — R161 Splenomegaly, not elsewhere classified: Secondary | ICD-10-CM | POA: Insufficient documentation

## 2022-05-23 DIAGNOSIS — I700 Atherosclerosis of aorta: Secondary | ICD-10-CM | POA: Insufficient documentation

## 2022-05-23 DIAGNOSIS — I8500 Esophageal varices without bleeding: Secondary | ICD-10-CM | POA: Diagnosis not present

## 2022-05-23 DIAGNOSIS — S01511A Laceration without foreign body of lip, initial encounter: Secondary | ICD-10-CM | POA: Diagnosis not present

## 2022-05-23 DIAGNOSIS — R519 Headache, unspecified: Secondary | ICD-10-CM | POA: Diagnosis not present

## 2022-05-23 DIAGNOSIS — W010XXA Fall on same level from slipping, tripping and stumbling without subsequent striking against object, initial encounter: Secondary | ICD-10-CM | POA: Diagnosis not present

## 2022-05-23 DIAGNOSIS — Z23 Encounter for immunization: Secondary | ICD-10-CM | POA: Insufficient documentation

## 2022-05-23 DIAGNOSIS — R188 Other ascites: Secondary | ICD-10-CM | POA: Diagnosis not present

## 2022-05-23 DIAGNOSIS — S2232XA Fracture of one rib, left side, initial encounter for closed fracture: Secondary | ICD-10-CM | POA: Diagnosis not present

## 2022-05-23 DIAGNOSIS — K4090 Unilateral inguinal hernia, without obstruction or gangrene, not specified as recurrent: Secondary | ICD-10-CM | POA: Diagnosis not present

## 2022-05-23 DIAGNOSIS — K5730 Diverticulosis of large intestine without perforation or abscess without bleeding: Secondary | ICD-10-CM | POA: Diagnosis not present

## 2022-05-23 DIAGNOSIS — M4312 Spondylolisthesis, cervical region: Secondary | ICD-10-CM | POA: Diagnosis not present

## 2022-05-23 DIAGNOSIS — S00531A Contusion of lip, initial encounter: Secondary | ICD-10-CM | POA: Diagnosis not present

## 2022-05-23 DIAGNOSIS — R9082 White matter disease, unspecified: Secondary | ICD-10-CM | POA: Diagnosis not present

## 2022-05-23 DIAGNOSIS — S3991XA Unspecified injury of abdomen, initial encounter: Secondary | ICD-10-CM | POA: Diagnosis not present

## 2022-05-23 DIAGNOSIS — S01512A Laceration without foreign body of oral cavity, initial encounter: Secondary | ICD-10-CM

## 2022-05-23 DIAGNOSIS — S00501A Unspecified superficial injury of lip, initial encounter: Secondary | ICD-10-CM | POA: Diagnosis present

## 2022-05-23 DIAGNOSIS — J189 Pneumonia, unspecified organism: Secondary | ICD-10-CM

## 2022-05-23 HISTORY — DX: Unspecified cirrhosis of liver: K74.60

## 2022-05-23 LAB — CBC WITH DIFFERENTIAL/PLATELET
Abs Immature Granulocytes: 0.02 10*3/uL (ref 0.00–0.07)
Basophils Absolute: 0 10*3/uL (ref 0.0–0.1)
Basophils Relative: 1 %
Eosinophils Absolute: 0.2 10*3/uL (ref 0.0–0.5)
Eosinophils Relative: 6 %
HCT: 28.8 % — ABNORMAL LOW (ref 36.0–46.0)
Hemoglobin: 9.5 g/dL — ABNORMAL LOW (ref 12.0–15.0)
Immature Granulocytes: 1 %
Lymphocytes Relative: 20 %
Lymphs Abs: 0.9 10*3/uL (ref 0.7–4.0)
MCH: 35.6 pg — ABNORMAL HIGH (ref 26.0–34.0)
MCHC: 33 g/dL (ref 30.0–36.0)
MCV: 107.9 fL — ABNORMAL HIGH (ref 80.0–100.0)
Monocytes Absolute: 0.4 10*3/uL (ref 0.1–1.0)
Monocytes Relative: 10 %
Neutro Abs: 2.7 10*3/uL (ref 1.7–7.7)
Neutrophils Relative %: 62 %
Platelets: 98 10*3/uL — ABNORMAL LOW (ref 150–400)
RBC: 2.67 MIL/uL — ABNORMAL LOW (ref 3.87–5.11)
RDW: 12.9 % (ref 11.5–15.5)
WBC: 4.3 10*3/uL (ref 4.0–10.5)
nRBC: 0 % (ref 0.0–0.2)

## 2022-05-23 LAB — BASIC METABOLIC PANEL
Anion gap: 8 (ref 5–15)
BUN: 8 mg/dL (ref 8–23)
CO2: 24 mmol/L (ref 22–32)
Calcium: 8.6 mg/dL — ABNORMAL LOW (ref 8.9–10.3)
Chloride: 104 mmol/L (ref 98–111)
Creatinine, Ser: 0.88 mg/dL (ref 0.44–1.00)
GFR, Estimated: >60 mL/min (ref >60)
Glucose, Bld: 113 mg/dL — ABNORMAL HIGH (ref 70–99)
Potassium: 4.2 mmol/L (ref 3.5–5.1)
Sodium: 136 mmol/L (ref 135–145)

## 2022-05-23 MED ORDER — IOHEXOL 300 MG/ML  SOLN
100.0000 mL | Freq: Once | INTRAMUSCULAR | Status: AC | PRN
  Administered 2022-05-23: 100 mL via INTRAVENOUS

## 2022-05-23 MED ORDER — CEFADROXIL 500 MG PO CAPS: 500.0000 mg | ORAL_CAPSULE | Freq: Two times a day (BID) | ORAL | 0 refills | Status: AC

## 2022-05-23 MED ORDER — TETANUS-DIPHTH-ACELL PERTUSSIS 5-2.5-18.5 LF-MCG/0.5 IM SUSY
0.5000 mL | PREFILLED_SYRINGE | Freq: Once | INTRAMUSCULAR | Status: AC
  Administered 2022-05-23: 0.5 mL via INTRAMUSCULAR
  Filled 2022-05-23: qty 0.5

## 2022-05-23 MED ORDER — SODIUM CHLORIDE 0.9 % IV SOLN
2.0000 g | Freq: Once | INTRAVENOUS | Status: AC
  Administered 2022-05-23: 2 g via INTRAVENOUS
  Filled 2022-05-23: qty 20

## 2022-05-23 MED ORDER — LIDOCAINE HCL (PF) 1 % IJ SOLN
5.0000 mL | Freq: Once | INTRAMUSCULAR | Status: AC
  Administered 2022-05-23: 5 mL
  Filled 2022-05-23: qty 5

## 2022-05-23 MED ORDER — AZITHROMYCIN 250 MG PO TABS: 250.0000 mg | ORAL_TABLET | Freq: Every day | ORAL | 0 refills | Status: DC

## 2022-05-23 NOTE — ED Provider Notes (Signed)
[facility] Provider Note   CSN: [id number] Arrival date & time: 05/23/22  1345     History  Chief Complaint  Patient presents with   Nicole Barrett is a 77 y.o. female who presents to the Emergency Department complaining of a fall onset PTA. Pt notes that she had a mechanical fall due to tripping on a dog leash and falling forward and hitting her head on the ground in her home.  Has a laceration on the inside of her mouth, neck pain, chest wall pain, rib pain, head pain.  No meds tried prior to arrival.  Denies anticoagulant use.  Denies LOC, chest pain, shortness of breath, nausea, vomiting, back pain.     The history is provided by the patient. No language interpreter was used.       Home Medications Prior to Admission medications   Medication Sig Start Date End Date Taking? Authorizing Provider  clonazePAM (KLONOPIN) 0.5 MG tablet Take 0.25 mg by mouth daily. 11/30/18   [provider]  diclofenac Sodium (VOLTAREN) 1 % GEL Apply 2 g topically daily as needed (arthritis pain).    [provider]  fexofenadine (ALLEGRA) 180 MG tablet Take 180 mg by mouth daily as needed for allergies (Spring/Fall).    [provider]  fluticasone (FLONASE) 50 MCG/ACT nasal spray Place 2 sprays into both nostrils daily as needed for allergies. 03/12/22   [provider]  furosemide (LASIX) 20 MG tablet Take 1 tablet (20 mg total) by mouth daily. 03/29/22   Hayden Rasmussen, MD  lactulose (CHRONULAC) 10 GM/15ML solution Take 15 mLs (10 g total) by mouth daily. Patient not taking: Reported on 04/17/2022 03/29/22   Hayden Rasmussen, MD  pantoprazole (PROTONIX) 40 MG tablet Take 1 tablet (40 mg total) by mouth 2 (two) times daily. 10/08/21   Sherron Monday, NP  spironolactone (ALDACTONE) 25 MG tablet Take 1 tablet (25 mg total) by mouth daily. 04/17/22   Sherron Monday, NP      Allergies    Codeine     Review of Systems   Review of Systems  All other systems reviewed and are negative.   Physical Exam Updated Vital Signs BP (!) 140/61 (BP Location: Left Arm)   Pulse 85   Temp (!) 97.4 F (36.3 C) (Oral)   Resp 18   SpO2 96%  Physical Exam Vitals and nursing note reviewed.  Constitutional:      General: She is not in acute distress.    Appearance: Normal appearance.  HENT:     Head: Laceration present.     Mouth/Throat:     Comments: Through and through 1 cm laceration noted to lower interior lip. 1 cm laceration noted inferior to lower lip.  Eyes:     General: No scleral icterus.    Extraocular Movements: Extraocular movements intact.  Cardiovascular:     Rate and Rhythm: Normal rate.  Pulmonary:     Effort: Pulmonary effort is normal. No respiratory distress.  Abdominal:     Palpations: Abdomen is soft. There is no mass.     Tenderness: There is no abdominal tenderness.  Musculoskeletal:        General: Normal range of motion.     Cervical back: Neck supple.  Skin:    General: Skin is warm and dry.     Findings: No rash.  Neurological:     Mental Status: She is  alert.     Sensory: Sensation is intact.     Motor: Motor function is intact.  Psychiatric:        Behavior: Behavior normal.     ED Results / Procedures / Treatments   Labs (all labs ordered are listed, but only abnormal results are displayed) Labs Reviewed - No data to display  EKG None  Radiology DG Chest 2 View  Result Date: 05/23/2022 CLINICAL DATA:  Fall. EXAM: CHEST - 2 VIEW COMPARISON:  June 20, 2008. FINDINGS: The heart size and mediastinal contours are within normal limits. Minimal bibasilar subsegmental atelectasis or edema is noted. Small left pleural effusion is noted. The visualized skeletal structures are unremarkable. IMPRESSION: Minimal bibasilar subsegmental atelectasis or edema. Small pleural effusion. Electronically Signed   By: Marijo Conception M.D.   On: 05/23/2022 14:27     Procedures .Marland KitchenLaceration Repair  Date/Time: 05/23/2022 7:17 PM  Performed by: Steva Colder A, PA-C Authorized by: Nehemiah Settle, PA-C   Consent:    Consent obtained:  Verbal and written   Consent given by:  Patient   Risks discussed:  Infection, pain and need for additional repair Universal protocol:    Patient identity confirmed:  Verbally with patient and hospital-assigned identification number Anesthesia:    Anesthesia method:  Local infiltration   Local anesthetic:  Lidocaine 1% w/o epi Laceration details:    Location:  Lip   Lip location:  Lower interior lip   Length (cm):  1 Exploration:    Hemostasis achieved with:  Direct pressure   Imaging outcome: foreign body not noted     Wound exploration: entire depth of wound visualized   Treatment:    Area cleansed with:  Saline   Amount of cleaning:  Standard   Irrigation solution:  Sterile saline   Irrigation method:  Syringe Skin repair:    Repair method:  Sutures   Suture size:  6-0 and 5-0   Suture material:  Chromic gut   Suture technique:  Simple interrupted   Number of sutures:  2 Approximation:    Approximation:  Close Repair type:    Repair type:  Simple Post-procedure details:    Dressing:  Open (no dressing)   Procedure completion:  Tolerated well, no immediate complications .Marland KitchenLaceration Repair  Date/Time: 05/23/2022 7:18 PM  Performed by: Nehemiah Settle, PA-C Authorized by: Nehemiah Settle, PA-C   Consent:    Consent obtained:  Verbal   Consent given by:  Patient   Risks discussed:  Infection, pain and need for additional repair Universal protocol:    Patient identity confirmed:  Verbally with patient and hospital-assigned identification number Anesthesia:    Anesthesia method:  Local infiltration   Local anesthetic:  Lidocaine 1% w/o epi Laceration details:    Location:  Face   Facial location: inferior lower lip.   Length (cm):  1 Pre-procedure details:    Preparation:  Patient was  prepped and draped in usual sterile fashion Exploration:    Hemostasis achieved with:  Direct pressure   Imaging outcome: foreign body not noted     Wound exploration: entire depth of wound visualized   Treatment:    Area cleansed with:  Saline   Amount of cleaning:  Standard   Irrigation solution:  Sterile saline   Irrigation method:  Syringe Skin repair:    Repair method:  Sutures   Suture size:  6-0   Suture material:  Prolene   Suture technique:  Simple interrupted  Number of sutures:  2 Approximation:    Approximation:  Close Repair type:    Repair type:  Simple Post-procedure details:    Dressing:  Open (no dressing)   Procedure completion:  Tolerated well, no immediate complications     Medications Ordered in ED Medications - No data to display  ED Course/ Medical Decision Making/ A&P Clinical Course as of 05/23/22 2259  Fri May 23, 2022  2104 discussed [SB]    Clinical Course User Index [SB] Sasan Wilkie A, PA-C                             Medical Decision Making Amount and/or Complexity of Data Reviewed Labs: ordered. Radiology: ordered.  Risk Prescription drug management.   Pt with fall occurring prior to arrival. Denies hitting their head, LOC, headache, vision changes. Vital signs patient afebrile. On exam, patient with through and through 1 cm laceration noted to lower anterior lip.  No spinal tenderness to palpation. No acute cardiovascular, respiratory, or abdominal exam findings. Differential diagnosis includes fracture, dislocation, herniation, contusion,***.   Additional history obtained:  Additional history obtained from Daughter/Son  Labs:  I ordered, and personally interpreted labs.  The pertinent results include:   CBC without leukocytosis, hemoglobin at 9.5, BMP unremarkable  Imaging: I ordered imaging studies including CT chest abdomen pelvis, CT head, CT cervical spine, CT maxillofacial, chest x-ray I independently visualized and  interpreted imaging which showed: *** I agree with the radiologist interpretation  Medications:  I ordered medication including Rocephin, Tdap for antibiotic treatment, prophylaxis I have reviewed the patients home medicines and have made adjustments as needed   Disposition: {End of MDM here with the likely diagnosis}. Patient likely with {musculoskeletal pain following injury.} After consideration of the diagnostic results and the patients response to treatment, I feel that the patient would benefit from {sabdispo:[id number]}.  Will prescribe Robaxin *** and short course Percocet *** to patient. Precautions discussed with patient *** regarding no driving or operating heavy machinery while taking medications. Patient *** acknowledged. {Incentive spirometer given today.} *** Patient instructed on its use. Will provide work note as well. Supportive care and return precautions discussed with patient and significant other. Return precautions given to the patient including shortness of breath, worsening chest pain, or worsening headache. Appears safe for discharge at this time. Follow up as indicated in discharge paperwork.   This chart was dictated using voice recognition software, Dragon. Despite the best efforts of this provider to proofread and correct errors, errors may still occur which can change documentation meaning.   Final Clinical Impression(s) / ED Diagnoses Final diagnoses:  None    Rx / DC Orders ED Discharge Orders     None

## 2022-05-23 NOTE — ED Notes (Signed)
Pt back from X-ray.  

## 2022-05-23 NOTE — ED Notes (Signed)
Patient transported to X-ray 

## 2022-05-23 NOTE — ED Triage Notes (Signed)
Pt tripped and fell pta. Fell face forward. Was witnessed by husband and denies LOC. Pt has swollen lower lip with slight bleeding. Pt c/o pain to lower lip, across ribs bilateral and anterior pelvic bone. Pt a/o.

## 2022-05-23 NOTE — Discharge Instructions (Addendum)
It was a pleasure taking care of you today!   Your labs didn't show any concerning findings tonight. Your CT scan of your chest showed incidental findings of pneumonia.  You were treated with a one-time dose of Rocephin in the emergency department.  You will be treated with Duricef and Zithromax, take as directed.  The CT of your abdomen pelvis again showed findings with your liver compatible with cirrhosis, splenomegaly as well as portal venous hypertension dealing with your liver concerns.  It also showed an incidental finding of a hernia to the right inguinal region.  For this he will be provided with information for the on-call general surgeon, Dr. Arnoldo Morale, call on Monday to set up a follow-up appointment regarding today's ED visit.  It also showed incidental finding of a lesion on the left kidney that is stable from the previous CT scan.  You can follow-up with your primary care provider for nonemergent imaging study of this.  The remainder of your CT scans did not show any acute emergent findings tonight.  For your pain at home you may take 500 mg Tylenol every 6 hours and alternate with 600 mg ibuprofen every 6 hours as needed for pain.  You may place ice or heat to the affected area for up to 15 minutes at a time.  Ensure to place a barrier between your skin and the ice/heat.  You may follow-up with your primary care provider, urgent care or emergency department for suture removal in 5 days.  You may return sooner should there be drainage or redness or increased pain noted to the area.  Maintain your scheduled follow-up appointments with your primary care provider and care team.  Return to the emergency department if you experience increasing/worsening symptoms.

## 2022-05-23 NOTE — ED Notes (Signed)
Patient transported to CT 

## 2022-05-30 DIAGNOSIS — S01501A Unspecified open wound of lip, initial encounter: Secondary | ICD-10-CM | POA: Diagnosis not present

## 2022-06-06 DIAGNOSIS — K7460 Unspecified cirrhosis of liver: Secondary | ICD-10-CM | POA: Diagnosis not present

## 2022-06-06 DIAGNOSIS — K219 Gastro-esophageal reflux disease without esophagitis: Secondary | ICD-10-CM | POA: Diagnosis not present

## 2022-06-06 DIAGNOSIS — R188 Other ascites: Secondary | ICD-10-CM | POA: Diagnosis not present

## 2022-06-07 LAB — BASIC METABOLIC PANEL
BUN: 9 mg/dL (ref 7–25)
CO2: 26 mmol/L (ref 20–32)
Calcium: 8.9 mg/dL (ref 8.6–10.4)
Chloride: 105 mmol/L (ref 98–110)
Creat: 0.66 mg/dL (ref 0.60–1.00)
Glucose, Bld: 150 mg/dL — ABNORMAL HIGH (ref 65–99)
Potassium: 4.6 mmol/L (ref 3.5–5.3)
Sodium: 138 mmol/L (ref 135–146)

## 2022-06-07 LAB — MAGNESIUM: Magnesium: 1.5 mg/dL (ref 1.5–2.5)

## 2022-06-09 ENCOUNTER — Other Ambulatory Visit: Payer: Self-pay | Admitting: Gastroenterology

## 2022-06-09 DIAGNOSIS — R188 Other ascites: Secondary | ICD-10-CM

## 2022-06-09 MED ORDER — SPIRONOLACTONE 50 MG PO TABS: 50.0000 mg | ORAL_TABLET | Freq: Every day | ORAL | 11 refills | Status: DC

## 2022-06-19 ENCOUNTER — Encounter: Payer: Self-pay | Admitting: General Surgery

## 2022-06-19 ENCOUNTER — Ambulatory Visit: Payer: Medicare Other | Admitting: General Surgery

## 2022-06-19 VITALS — BP 158/88 | HR 75 | Temp 97.5°F | Resp 12 | Ht 65.0 in | Wt 149.0 lb

## 2022-06-19 DIAGNOSIS — K4090 Unilateral inguinal hernia, without obstruction or gangrene, not specified as recurrent: Secondary | ICD-10-CM | POA: Diagnosis not present

## 2022-06-19 NOTE — Progress Notes (Signed)
Nicole Barrett; [id number]; May 11, 1945   HPI Patient is a 77 year old white female who was referred to my care by the emergency room and Dr. Asencion Noble for evaluation and treatment of a right inguinal hernia.  The patient was recently seen in the emergency room after sustaining a fall and was found on CT scan of the abdomen and pelvis to have a right inguinal hernia containing fluid and bowel.  She has a history of cirrhosis with splenomegaly, thrombocytopenia, ascites, and bipedal edema.  She is on spironolactone and Lasix.  She is being followed by GI.  She denies any nausea or vomiting.  She actually complains of left lower quadrant abdominal pain. Past Medical History:  Diagnosis Date   Anxiety    Cirrhosis ([place])    GERD (gastroesophageal reflux disease)    History of hiatal hernia    HOH (hard of hearing)    Macular degeneration     Past Surgical History:  Procedure Laterality Date   CATARACT EXTRACTION EXTRACAPSULAR Right 05/24/2021   Procedure: CATARACT EXTRACTION PHACO AND INTRAOCULAR LENS PLACEMENT ([place]);  Surgeon: Baruch Goldmann, MD;  Location: [facility];  Service: Ophthalmology;  Laterality: Right;  CDE 12.48   CATARACT EXTRACTION W/PHACO Left 06/07/2021   Procedure: CATARACT EXTRACTION PHACO AND INTRAOCULAR LENS PLACEMENT ([facility]);  Surgeon: Baruch Goldmann, MD;  Location: [facility];  Service: Ophthalmology;  Laterality: Left;  CDE: 5.36   CHOLECYSTECTOMY  2011   Dr. Geroge Baseman   COLONOSCOPY N/A 04/05/2014   [id number] diverticulosis,colonic polyps removed (TA), random colon biopsies negative. next TCS 03/2021   cyst removed from right hand     ESOPHAGOGASTRODUODENOSCOPY N/A 04/05/2014   [id number] schazkis ring/HH, abnormal gastric mucosa with gastric polyps (benign)    Family History  Problem Relation Age of Onset   Pancreatic cancer Mother    Diabetes Other    Heart disease Other    Colon cancer Neg Hx     Current Outpatient Medications on File Prior to Visit  Medication  Sig Dispense Refill   clonazePAM (KLONOPIN) 0.5 MG tablet Take 0.25 mg by mouth daily.     diclofenac Sodium (VOLTAREN) 1 % GEL Apply 2 g topically daily as needed (arthritis pain).     fexofenadine (ALLEGRA) 180 MG tablet Take 180 mg by mouth daily as needed for allergies (Spring/Fall).     fluticasone (FLONASE) 50 MCG/ACT nasal spray Place 2 sprays into both nostrils daily as needed for allergies.     furosemide (LASIX) 20 MG tablet Take 1 tablet (20 mg total) by mouth daily. 30 tablet 0   pantoprazole (PROTONIX) 40 MG tablet Take 1 tablet (40 mg total) by mouth 2 (two) times daily. 180 tablet 3   spironolactone (ALDACTONE) 50 MG tablet Take 1 tablet (50 mg total) by mouth daily. 30 tablet 11   lactulose (CHRONULAC) 10 GM/15ML solution Take 15 mLs (10 g total) by mouth daily. (Patient not taking: Reported on 04/17/2022) 946 mL 0   No current facility-administered medications on file prior to visit.    Allergies  Allergen Reactions   Codeine Nausea And Vomiting    Social History   Substance and Sexual Activity  Alcohol Use Not Currently   Alcohol/week: 14.0 standard drinks of alcohol   Types: 14 Glasses of wine per week    Social History   Tobacco Use  Smoking Status Former   Packs/day: 1.50   Years: 20.00   Total pack years: 30.00   Types: Cigarettes   Quit date: 1984  Years since quitting: 40.1   Passive exposure: Past  Smokeless Tobacco Never    Review of Systems  Constitutional:  Positive for malaise/fatigue.  HENT:  Positive for sinus pain.   Eyes: Negative.   Respiratory: Negative.    Cardiovascular: Negative.   Gastrointestinal:  Positive for heartburn.  Genitourinary: Negative.   Musculoskeletal:  Positive for back pain, joint pain and neck pain.  Skin: Negative.   Neurological: Negative.   Endo/Heme/Allergies: Negative.   Psychiatric/Behavioral: Negative.      Objective   Vitals:   06/19/22 1005  BP: (!) 158/88  Pulse: 75  Resp: 12  Temp: (!)  97.5 F (36.4 C)  SpO2: 100%    Physical Exam Vitals reviewed.  Constitutional:      Appearance: Normal appearance. She is not ill-appearing.  HENT:     Head: Normocephalic and atraumatic.  Cardiovascular:     Rate and Rhythm: Normal rate and regular rhythm.     Heart sounds: Normal heart sounds. No murmur heard.    No friction rub. No gallop.  Pulmonary:     Effort: Pulmonary effort is normal. No respiratory distress.     Breath sounds: Normal breath sounds. No stridor. No wheezing, rhonchi or rales.  Abdominal:     General: Bowel sounds are normal. There is distension.     Palpations: Abdomen is soft. There is no mass.     Tenderness: There is no abdominal tenderness. There is no guarding or rebound.     Hernia: A hernia is present.     Comments: Patient does have an easily reducible right lower quadrant hernia.  It was difficult to assess whether this was containing bowel or just ascites.  She does have a distended abdomen with what appears to be a fluid wave.  Musculoskeletal:        General: Swelling present.  Skin:    General: Skin is warm and dry.  Neurological:     Mental Status: She is alert and oriented to person, place, and time.    ER notes reviewed.  GI notes reviewed. Assessment  Asymptomatic right inguinal hernia.  This is in the face of cirrhosis with ascites which is only partially controlled. Plan  I told the patient and family member that I do not recommend repair of the hernia.  Patient is at increased for morbidity and mortality in the face of uncontrolled cirrhosis with ascites.  Luckily, she is asymptomatic and does not need to have the hernia addressed at the present time.  Getting her ascites under control may be the best way to prevent any problems with the hernia.  Should the hernia become an issue, she would need to be referred to a tertiary care center for further evaluation and treatment.

## 2022-07-13 ENCOUNTER — Other Ambulatory Visit: Payer: Self-pay | Admitting: Gastroenterology

## 2022-07-29 ENCOUNTER — Other Ambulatory Visit (INDEPENDENT_AMBULATORY_CARE_PROVIDER_SITE_OTHER): Payer: Self-pay

## 2022-07-29 ENCOUNTER — Encounter (INDEPENDENT_AMBULATORY_CARE_PROVIDER_SITE_OTHER): Payer: Self-pay

## 2022-07-29 DIAGNOSIS — Z860100 Personal history of colon polyps, unspecified: Secondary | ICD-10-CM

## 2022-07-29 DIAGNOSIS — K317 Polyp of stomach and duodenum: Secondary | ICD-10-CM

## 2022-07-29 DIAGNOSIS — R1013 Epigastric pain: Secondary | ICD-10-CM

## 2022-07-29 DIAGNOSIS — K529 Noninfective gastroenteritis and colitis, unspecified: Secondary | ICD-10-CM

## 2022-07-29 DIAGNOSIS — K760 Fatty (change of) liver, not elsewhere classified: Secondary | ICD-10-CM

## 2022-07-29 DIAGNOSIS — K625 Hemorrhage of anus and rectum: Secondary | ICD-10-CM

## 2022-07-29 DIAGNOSIS — K219 Gastro-esophageal reflux disease without esophagitis: Secondary | ICD-10-CM

## 2022-08-04 ENCOUNTER — Encounter: Payer: Self-pay | Admitting: Gastroenterology

## 2022-08-06 ENCOUNTER — Telehealth: Payer: Self-pay | Admitting: *Deleted

## 2022-08-06 NOTE — Telephone Encounter (Signed)
Pt's husband called and said he thinks she has a cold or allergies not sure, but wants to know what she can take. Also has arthritis in her shoulder and wants to know if it is ok if she gets another shot.

## 2022-08-07 ENCOUNTER — Encounter: Payer: Self-pay | Admitting: *Deleted

## 2022-08-07 NOTE — Telephone Encounter (Signed)
Spoke to pt's husband Gala Romney [facility]) informed him of recommendations. He voiced understanding. Also sent MyChart message.

## 2022-08-19 ENCOUNTER — Telehealth: Payer: Self-pay

## 2022-08-19 NOTE — Telephone Encounter (Signed)
Pt's husband ([facility]) called and states that pt stays sleepy all the time. He states that the lactulose and spironolactone is making her feel this way. He states she is not swelling any more and would like to know if she could stop taking these medications. You can send him a MyChart message.

## 2022-08-19 NOTE — Telephone Encounter (Signed)
Pt's husband called and lm stating that the diuretics that the pt is currently taking is making her sleepy. Husband states that her legs are not swollen and is wanting to know if the diuretics are necessary. Please advise.

## 2022-08-20 NOTE — Telephone Encounter (Signed)
Noted  

## 2022-08-29 DIAGNOSIS — K760 Fatty (change of) liver, not elsewhere classified: Secondary | ICD-10-CM | POA: Diagnosis not present

## 2022-08-29 DIAGNOSIS — K625 Hemorrhage of anus and rectum: Secondary | ICD-10-CM | POA: Diagnosis not present

## 2022-08-29 DIAGNOSIS — K219 Gastro-esophageal reflux disease without esophagitis: Secondary | ICD-10-CM | POA: Diagnosis not present

## 2022-08-29 DIAGNOSIS — K529 Noninfective gastroenteritis and colitis, unspecified: Secondary | ICD-10-CM | POA: Diagnosis not present

## 2022-08-29 DIAGNOSIS — R1013 Epigastric pain: Secondary | ICD-10-CM | POA: Diagnosis not present

## 2022-08-30 LAB — CBC WITH DIFFERENTIAL/PLATELET
Absolute Monocytes: 334 cells/uL (ref 200–950)
Basophils Absolute: 30 cells/uL (ref 0–200)
Basophils Relative: 0.8 %
Eosinophils Absolute: 152 cells/uL (ref 15–500)
Eosinophils Relative: 4 %
HCT: 29 % — ABNORMAL LOW (ref 35.0–45.0)
Hemoglobin: 10 g/dL — ABNORMAL LOW (ref 11.7–15.5)
Lymphs Abs: 1091 cells/uL (ref 850–3900)
MCH: 34.1 pg — ABNORMAL HIGH (ref 27.0–33.0)
MCHC: 34.5 g/dL (ref 32.0–36.0)
MCV: 99 fL (ref 80.0–100.0)
MPV: 9.7 fL (ref 7.5–12.5)
Monocytes Relative: 8.8 %
Neutro Abs: 2193 cells/uL (ref 1500–7800)
Neutrophils Relative %: 57.7 %
Platelets: 75 10*3/uL — ABNORMAL LOW (ref 140–400)
RBC: 2.93 10*6/uL — ABNORMAL LOW (ref 3.80–5.10)
RDW: 12.4 % (ref 11.0–15.0)
Total Lymphocyte: 28.7 %
WBC: 3.8 10*3/uL (ref 3.8–10.8)

## 2022-08-30 LAB — COMPREHENSIVE METABOLIC PANEL
AG Ratio: 1.8 (calc) (ref 1.0–2.5)
ALT: 12 U/L (ref 6–29)
AST: 21 U/L (ref 10–35)
Albumin: 3.7 g/dL (ref 3.6–5.1)
Alkaline phosphatase (APISO): 71 U/L (ref 37–153)
BUN: 8 mg/dL (ref 7–25)
CO2: 26 mmol/L (ref 20–32)
Calcium: 9 mg/dL (ref 8.6–10.4)
Chloride: 105 mmol/L (ref 98–110)
Creat: 0.83 mg/dL (ref 0.60–1.00)
Globulin: 2.1 g/dL (calc) (ref 1.9–3.7)
Glucose, Bld: 106 mg/dL — ABNORMAL HIGH (ref 65–99)
Potassium: 3.8 mmol/L (ref 3.5–5.3)
Sodium: 137 mmol/L (ref 135–146)
Total Bilirubin: 1.2 mg/dL (ref 0.2–1.2)
Total Protein: 5.8 g/dL — ABNORMAL LOW (ref 6.1–8.1)

## 2022-08-30 LAB — PROTIME-INR
INR: 1.1
Prothrombin Time: 12 s — ABNORMAL HIGH (ref 9.0–11.5)

## 2022-09-02 ENCOUNTER — Encounter: Payer: Self-pay | Admitting: Gastroenterology

## 2022-09-02 ENCOUNTER — Telehealth: Payer: Self-pay | Admitting: *Deleted

## 2022-09-02 ENCOUNTER — Other Ambulatory Visit: Payer: Self-pay | Admitting: *Deleted

## 2022-09-02 DIAGNOSIS — R188 Other ascites: Secondary | ICD-10-CM

## 2022-09-02 NOTE — Telephone Encounter (Signed)
Pt's husband called and stated that she received a letter stating it was time for a RUQ [place]. He requested it be on a Friday afternoon.   [place] scheduled for 09/19/22, arrive at 3 pm to check in. NPO 6 hours prior. [name] (on [place]) advised of appt date, time and instructions.

## 2022-09-19 ENCOUNTER — Ambulatory Visit (HOSPITAL_COMMUNITY)
Admission: RE | Admit: 2022-09-19 | Discharge: 2022-09-19 | Disposition: A | Discharge status: Home / Self Care | Payer: Medicare Other | Source: Ambulatory Visit | Attending: Gastroenterology | Admitting: Gastroenterology

## 2022-09-19 DIAGNOSIS — R188 Other ascites: Secondary | ICD-10-CM | POA: Insufficient documentation

## 2022-09-19 DIAGNOSIS — K7460 Unspecified cirrhosis of liver: Secondary | ICD-10-CM | POA: Insufficient documentation

## 2022-09-21 NOTE — Progress Notes (Deleted)
GI Office Note    Referring Provider: Carylon Perches, MD Primary Care Physician:  Carylon Perches, MD Primary Gastroenterologist: [name].Rourk, MD  Date:  09/22/2022  ID:  Nicole Barrett, DOB July 25, 1945, MRN [id number]   Chief Complaint   No chief complaint on file.  History of Present Illness  Nicole Barrett is a 77 y.o. female with a history of hiatal hernia, GERD, anxiety, macular degeneration, and cirrhosis presenting today for follow up.   EGD December 2015: -noncritical Schatzki's ring -small hiatal hernia -abnormal gastric mucosa with gastric polyps (benign biopsy)    Colonoscopy December 2015: -diverticulosis -tubular adenomas removed -random colon biopsies negative  -Repeat in 7 years   Patient offered colonoscopy during virtual visit in June however patient declined. Pantoprazole BID refilled for GERD. Cologuard offered and declined.    PCP recently performed CT A/P 11/29 noting hepatic steatosis and capsular nodularity consistent with cirrhosis. Prior cholecystectomy. Normal pancreas. Splenomegaly present (portal hypertension). Diffuse gastric and small bowel wall thickening and mesenteric edema consistent with portal gastropathy. Sigmoid diverticulosis. Mild to moderate ascites and diffuse mesenteric and body wall edema.    Had ED visit 03/29/22. GI consulted with in ED and Dr. Jena Gauss recommended low sodium diet, low dose lasix and to begin lactulose. Labs: Na 132, albumin 2.8, cr 0.71, T Bili 1.7, AST 50, AT 18, Alk phos 137, Hgb 11.3, MCV 117, plts 97, ammonia 25, INR 1.3, BNP 236   Last visit 04/17/22. Lower abdominal edema and ongoing peripheral edema. BM 2-3 per day. No fevers/chills. Lasix 20mg  daily. 2 glasses wine daily. Appetite improved since ED visit. Watching salt intake. Pain with walking and standing. Taking advil PM. Trouble with insomnia. Not weighing at home. Labs ordered. Continue PPI. Continue lasix 20 mg daily, briefly increase to BID. Start spironolactone  25 mg daily. BMP in 1 week.    Labs 05/02/22: Not immune to hep A or Hep B. No active viral hepatitis. AFP elevated at 9.1. ANA, ASMA, AMA negative. BMP wnl. INR 1.2  Last office visit 05/15/22.  Lower extreme edema improved but still having some fluid in her thighs and knees.  Recent cellulitis.  1-2 bowel movements daily.  Occasional confusion but nothing more above baseline.  And mobility improved.  Spironolactone increased from 25 mg to 50 mg.  Lasix continued at 20 mg daily.  At the end of April patient's husband reported patient has been little bit more sleepy, not taking lactulose on a regular basis.  The diuretics were causing her sleepiness.  Offered labs lactulose was sent in to take and titrate for bowel movements.  Advised that diuretics in the afternoon  could cause issues with sleep given need for nocturnal urination.   Labs 08/29/22: Hemoglobin 10, Platelets 75, sodium 137, Cr 0.83, normal LFTs, T. bili 1.2, albumin 3.7, INR 1.1  Today: Cirrhosis history Hematemesis/coffee ground emesis: *** History of variceal bleeding: *** Abdominal pain: *** Abdominal distention/worsening ascites: *** Fever/chills: *** Episodes of confusion/disorientation: *** Number of daily bowel movements: *** Taking diuretics?: spironolactone 50 mg and lasix 20mg  Date of last EGD: 2015, due for variceal screening given thrombocytopenia Prior history of banding?: No Prior episodes of SBP: No Last time liver imaging was performed:09/19/22 - cirrhosis, no hepatoma.  No immune to Hep A or Hep B. Vaccinated?***  MELD-Na: 8 MELD 3.0: 9  GERD -   Current Outpatient Medications  Medication Sig Dispense Refill   clonazePAM (KLONOPIN) 0.5 MG tablet Take 0.25 mg by mouth daily.  diclofenac Sodium (VOLTAREN) 1 % GEL Apply 2 g topically daily as needed (arthritis pain).     fexofenadine (ALLEGRA) 180 MG tablet Take 180 mg by mouth daily as needed for allergies (Spring/Fall).     fluticasone (FLONASE) 50  MCG/ACT nasal spray Place 2 sprays into both nostrils daily as needed for allergies.     furosemide (LASIX) 20 MG tablet Take 1 tablet (20 mg total) by mouth daily. 30 tablet 0   lactulose (CHRONULAC) 10 GM/15ML solution Take 15 mLs (10 g total) by mouth daily. (Patient not taking: Reported on 04/17/2022) 946 mL 0   pantoprazole (PROTONIX) 40 MG tablet Take 1 tablet (40 mg total) by mouth 2 (two) times daily. 180 tablet 3   spironolactone (ALDACTONE) 50 MG tablet Take 1 tablet (50 mg total) by mouth daily. 30 tablet 11   No current facility-administered medications for this visit.    Past Medical History:  Diagnosis Date   Anxiety    Cirrhosis (HCC)    GERD (gastroesophageal reflux disease)    History of hiatal hernia    HOH (hard of hearing)    Macular degeneration     Past Surgical History:  Procedure Laterality Date   CATARACT EXTRACTION EXTRACAPSULAR Right 05/24/2021   Procedure: CATARACT EXTRACTION PHACO AND INTRAOCULAR LENS PLACEMENT (IOC);  Surgeon: Fabio Pierce, MD;  Location: [facility];  Service: Ophthalmology;  Laterality: Right;  CDE 12.48   CATARACT EXTRACTION W/PHACO Left 06/07/2021   Procedure: CATARACT EXTRACTION PHACO AND INTRAOCULAR LENS PLACEMENT (IOC);  Surgeon: Fabio Pierce, MD;  Location: [facility];  Service: Ophthalmology;  Laterality: Left;  CDE: 5.36   CHOLECYSTECTOMY  2011   Dr. Leticia Penna   COLONOSCOPY N/A 04/05/2014   [id number] diverticulosis,colonic polyps removed (TA), random colon biopsies negative. next TCS 03/2021   cyst removed from right hand     ESOPHAGOGASTRODUODENOSCOPY N/A 04/05/2014   [id number] schazkis ring/HH, abnormal gastric mucosa with gastric polyps (benign)    Family History  Problem Relation Age of Onset   Pancreatic cancer Mother    Diabetes Other    Heart disease Other    Colon cancer Neg Hx     Allergies as of 09/22/2022 - Review Complete 06/19/2022  Allergen Reaction Noted   Codeine Nausea And Vomiting 03/14/2014     Social History   Socioeconomic History   Marital status: Married    Spouse name: Not on file   Number of children: 1   Years of education: Not on file   Highest education level: Not on file  Occupational History   Not on file  Tobacco Use   Smoking status: Former    Packs/day: 1.50    Years: 20.00    Additional pack years: 0.00    Total pack years: 30.00    Types: Cigarettes    Quit date: 63    Years since quitting: 40.4    Passive exposure: Past   Smokeless tobacco: Never  Substance and Sexual Activity   Alcohol use: Not Currently    Alcohol/week: 14.0 standard drinks of alcohol    Types: 14 Glasses of wine per week   Drug use: No   Sexual activity: Not on file  Other Topics Concern   Not on file  Social History Narrative   Not on file   Social Determinants of Health   Financial Resource Strain: Not on file  Food Insecurity: Not on file  Transportation Needs: Not on file  Physical Activity: Not on file  Stress: Not on  file  Social Connections: Not on file     Review of Systems   Gen: Denies fever, chills, anorexia. Denies fatigue, weakness, weight loss.  CV: Denies chest pain, palpitations, syncope, peripheral edema, and claudication. Resp: Denies dyspnea at rest, cough, wheezing, coughing up blood, and pleurisy. GI: See HPI Derm: Denies rash, itching, dry skin Psych: Denies depression, anxiety, memory loss, confusion. No homicidal or suicidal ideation.  Heme: Denies bruising, bleeding, and enlarged lymph nodes.   Physical Exam   There were no vitals taken for this visit.  General:   Alert and oriented. No distress noted. Pleasant and cooperative.  Head:  Normocephalic and atraumatic. Eyes:  Conjuctiva clear without scleral icterus. Mouth:  Oral mucosa pink and moist. Good dentition. No lesions. Lungs:  Clear to auscultation bilaterally. No wheezes, rales, or rhonchi. No distress.  Heart:  S1, S2 present without murmurs appreciated.  Abdomen:   +BS, soft, non-tender and non-distended. No rebound or guarding. No HSM or masses noted. Rectal: *** Msk:  Symmetrical without gross deformities. Normal posture. Extremities:  Without edema. Neurologic:  Alert and  oriented x4 Psych:  Alert and cooperative. Normal mood and affect.   Assessment  Nicole Barrett is a 77 y.o. female with a history of hiatal hernia, GERD, anxiety, macular degeneration, and cirrhosis presenting today for follow up.    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  Last AFP elevated at 9.1 however recent RUQ [place] 09/19/2022 with no evidence of hepatoma. MELD score stable at 8.  Currently on Lasix 20 mg daily and spironolactone 50 mg daily for control of ascites and peripheral edema.  Given thrombocytopenia she should have variceal screening performed via EGD which has not yet been completed.***  PLAN   *** EGD? RUQ [place] November 2024 MELD labs in November 2024 Lasix *** Spironolactone *** Lactulose 10g daily. Titrate for 2-3 BM daily.  2g Sodium diet  Follow-up 6 months    Brooke Bonito, MSN, FNP-BC, AGACNP-BC [facility]

## 2022-09-22 ENCOUNTER — Ambulatory Visit: Payer: Medicare Other | Admitting: Gastroenterology

## 2022-10-07 NOTE — Progress Notes (Unsigned)
GI Office Note    Referring Provider: Carylon Perches, MD Primary Care Physician:  Carylon Perches, MD Primary Gastroenterologist: [name].Rourk, MD  Date:  10/08/2022  ID:  Nicole Barrett, DOB 1945-10-22, MRN [id number]   Chief Complaint   Chief Complaint  Patient presents with   Follow-up    Follow up on results of procedure and labs. Med recheck and Cirrhosis   History of Present Illness  Nicole Barrett is a 77 y.o. female with a history of hiatal hernia, GERD, anxiety, macular degeneration, and cirrhosis presenting today for follow up.   EGD December 2015: -noncritical Schatzki's ring -small hiatal hernia -abnormal gastric mucosa with gastric polyps (benign biopsy)    Colonoscopy December 2015: -diverticulosis -tubular adenomas removed -random colon biopsies negative  -Repeat in 7 years   Patient offered colonoscopy during virtual visit in June however patient declined. Pantoprazole BID refilled for GERD. Cologuard offered and declined.    PCP recently performed CT A/P 11/29 noting hepatic steatosis and capsular nodularity consistent with cirrhosis. Prior cholecystectomy. Normal pancreas. Splenomegaly present (portal hypertension). Diffuse gastric and small bowel wall thickening and mesenteric edema consistent with portal gastropathy. Sigmoid diverticulosis. Mild to moderate ascites and diffuse mesenteric and body wall edema.    Had ED visit 03/29/22. GI consulted with in ED and Dr. Jena Gauss recommended low sodium diet, low dose lasix and to begin lactulose. Labs: Na 132, albumin 2.8, cr 0.71, T Bili 1.7, AST 50, AT 18, Alk phos 137, Hgb 11.3, MCV 117, plts 97, ammonia 25, INR 1.3, BNP 236   Last visit 04/17/22. Lower abdominal edema and ongoing peripheral edema. BM 2-3 per day. No fevers/chills. Lasix 20mg  daily. 2 glasses wine daily. Appetite improved since ED visit. Watching salt intake. Pain with walking and standing. Taking advil PM. Trouble with insomnia. Not weighing at home.  Labs ordered. Continue PPI. Continue lasix 20 mg daily, briefly increase to BID. Start spironolactone 25 mg daily. BMP in 1 week.    Labs 05/02/22: Not immune to hep A or Hep B. No active viral hepatitis. AFP elevated at 9.1. ANA, ASMA, AMA negative. BMP wnl. INR 1.2   Last office visit 05/15/22.  Lower extreme edema improved but still having some fluid in her thighs and knees.  Recent cellulitis.  1-2 bowel movements daily.  Occasional confusion but nothing more above baseline.  And mobility improved.  Spironolactone increased from 25 mg to 50 mg.  Lasix continued at 20 mg daily.   At the end of April patient's husband reported patient has been little bit more sleepy, not taking lactulose on a regular basis.  The diuretics were causing her sleepiness.  Offered labs,  lactulose was sent in to take and titrate for bowel movements.  Advised that diuretics in the afternoon  could cause issues with sleep given need for nocturnal urination.    Labs 08/29/22: Hemoglobin 10, Platelets 75, sodium 137, Cr 0.83, normal LFTs, T. bili 1.2, albumin 3.7, INR 1.1   Today: Cirrhosis history Hematemesis/coffee ground emesis: None History of variceal bleeding: None Abdominal pain: None Abdominal distention/worsening ascites: none Fever/chills: none Episodes of confusion/disorientation: will diuretics but none otherwise Number of daily bowel movements: taking the lactulose - 1 Tb spoons daily. Usually 2 per day. Normal semi formed - she has always been regular and eats a lot of fresh vegetables.   Taking diuretics: Stopped the lasix and spironolactone and since she is not having a lot of swelling and overall is not feeling sleepy  and not acting like a zombie. (Stayed very tired and wanted to sleep but also possibly feeling a little confused).   Date of last EGD: 2015, due for variceal screening given thrombocytopenia Prior history of banding?: No Prior episodes of SBP: No Last time liver imaging was  performed:09/19/22 - cirrhosis, no hepatoma.    Drinking water and cranberry juice throughput the day. Following a  low sodium diet.   MELD-Na: 8 MELD 3.0: 9   GERD - Has not had any issue. Usual once daily usually and only does 2nd dose if she really needs it.   Taking melatonin nightly.   Current Outpatient Medications  Medication Sig Dispense Refill   acetaminophen (TYLENOL) 325 MG tablet Take 650 mg by mouth every 6 (six) hours as needed.     clonazePAM (KLONOPIN) 0.5 MG tablet Take 0.25 mg by mouth daily.     diclofenac Sodium (VOLTAREN) 1 % GEL Apply 2 g topically daily as needed (arthritis pain).     fexofenadine (ALLEGRA) 180 MG tablet Take 180 mg by mouth daily as needed for allergies (Spring/Fall).     fluticasone (FLONASE) 50 MCG/ACT nasal spray Place 2 sprays into both nostrils daily as needed for allergies.     lactulose (CHRONULAC) 10 GM/15ML solution Take 15 mLs (10 g total) by mouth daily. 946 mL 0   pantoprazole (PROTONIX) 40 MG tablet Take 1 tablet (40 mg total) by mouth 2 (two) times daily. 180 tablet 3   furosemide (LASIX) 20 MG tablet Take 1 tablet (20 mg total) by mouth daily. (Patient not taking: Reported on 10/08/2022) 30 tablet 0   spironolactone (ALDACTONE) 50 MG tablet Take 1 tablet (50 mg total) by mouth daily. (Patient not taking: Reported on 10/08/2022) 30 tablet 11   No current facility-administered medications for this visit.    Past Medical History:  Diagnosis Date   Anxiety    Cirrhosis (HCC)    GERD (gastroesophageal reflux disease)    History of hiatal hernia    HOH (hard of hearing)    Macular degeneration     Past Surgical History:  Procedure Laterality Date   CATARACT EXTRACTION EXTRACAPSULAR Right 05/24/2021   Procedure: CATARACT EXTRACTION PHACO AND INTRAOCULAR LENS PLACEMENT (IOC);  Surgeon: Fabio Pierce, MD;  Location: [facility];  Service: Ophthalmology;  Laterality: Right;  CDE 12.48   CATARACT EXTRACTION W/PHACO Left 06/07/2021    Procedure: CATARACT EXTRACTION PHACO AND INTRAOCULAR LENS PLACEMENT (IOC);  Surgeon: Fabio Pierce, MD;  Location: [facility];  Service: Ophthalmology;  Laterality: Left;  CDE: 5.36   CHOLECYSTECTOMY  2011   Dr. Leticia Penna   COLONOSCOPY N/A 04/05/2014   [id number] diverticulosis,colonic polyps removed (TA), random colon biopsies negative. next TCS 03/2021   cyst removed from right hand     ESOPHAGOGASTRODUODENOSCOPY N/A 04/05/2014   [id number] schazkis ring/HH, abnormal gastric mucosa with gastric polyps (benign)    Family History  Problem Relation Age of Onset   Pancreatic cancer Mother    Diabetes Other    Heart disease Other    Colon cancer Neg Hx     Allergies as of 10/08/2022 - Review Complete 10/08/2022  Allergen Reaction Noted   Codeine Nausea And Vomiting 03/14/2014    Social History   Socioeconomic History   Marital status: Married    Spouse name: Not on file   Number of children: 1   Years of education: Not on file   Highest education level: Not on file  Occupational History   Not on  file  Tobacco Use   Smoking status: Former    Packs/day: 1.50    Years: 20.00    Additional pack years: 0.00    Total pack years: 30.00    Types: Cigarettes    Quit date: 5    Years since quitting: 40.4    Passive exposure: Past   Smokeless tobacco: Never  Substance and Sexual Activity   Alcohol use: Not Currently    Alcohol/week: 14.0 standard drinks of alcohol    Types: 14 Glasses of wine per week   Drug use: No   Sexual activity: Not on file  Other Topics Concern   Not on file  Social History Narrative   Not on file   Social Determinants of Health   Financial Resource Strain: Not on file  Food Insecurity: Not on file  Transportation Needs: Not on file  Physical Activity: Not on file  Stress: Not on file  Social Connections: Not on file     Review of Systems   Gen: Denies fever, chills, anorexia. Denies fatigue, weakness, weight loss.  CV: Denies chest  pain, palpitations, syncope, peripheral edema, and claudication. Resp: Denies dyspnea at rest, cough, wheezing, coughing up blood, and pleurisy. GI: See HPI Derm: Denies rash, itching, dry skin Psych: Denies depression, anxiety, memory loss, confusion. No homicidal or suicidal ideation.  Heme: Denies bruising, bleeding, and enlarged lymph nodes.   Physical Exam   BP (!) 152/68   Pulse 68   Temp 97.6 F (36.4 C)   Ht 5\' 5"  (1.651 m)   Wt 149 lb 9.6 oz (67.9 kg)   BMI 24.89 kg/m   General:   Alert and oriented. No distress noted. Pleasant and cooperative.  Head:  Normocephalic and atraumatic. Eyes:  Conjuctiva clear without scleral icterus. Mouth:  Oral mucosa pink and moist. Good dentition. No lesions. Lungs:  Clear to auscultation bilaterally. No wheezes, rales, or rhonchi. No distress.  Heart:  S1, S2 present without murmurs appreciated.  Abdomen:  +BS, soft, non-tender and non-distended. No rebound or guarding. No HSM or masses noted. Rectal: deferred Msk:  Symmetrical without gross deformities. Normal posture. Extremities:  Without edema. Neurologic:  Alert and  oriented x4 Psych:  Alert and cooperative. Normal mood and affect.   Assessment  Nicole Barrett is a 77 y.o. female with a history of hiatal hernia, GERD, anxiety, macular degeneration, and cirrhosis presenting today for follow up.     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  Last AFP elevated at 9.1 however recent RUQ [place] 09/19/2022 with no evidence of hepatoma. MELD score stable at 8.  Currently without any ascites or peripheral edema off of Lasix and spironolactone which she stopped due to excessive sleepiness.  Has been following a low-sodium diet diet eating lots of vegetables.  Overall she appears well compensated at this point.  Given thrombocytopenia she should have variceal screening which we will schedule for today.  We will repeat ultrasound and labs in November 2024.  Anemia: Most recent  hemoglobin stable at 10.  Denies any GI bleeding.  Does have some mild fatigue but this has been a chronic issue.  Is sleeping better now that she is taking melatonin.  GERD: Well-controlled with pantoprazole 40 mg once to twice daily.  She does have a history of colon polyps however we have discussed colonoscopy previously she has declined and has also declined performing a Cologuard.  Denies any melena or BRBPR.  PLAN   Proceed with upper endoscopy  with propofol by Dr. Jena Gauss in near future: the risks, benefits, and alternatives have been discussed with the patient in detail. The patient states understanding and desires to proceed. ASA 3  RUQ [place] November 2024 MELD labs in November 2024 Continue Lactulose 10g daily. Titrate for 2-3 BM daily.  2g Sodium diet Continue pantoprazole 40 mg once to twice daily. Follow up in 6 months     Brooke Bonito, MSN, FNP-BC, AGACNP-BC [facility]

## 2022-10-08 ENCOUNTER — Ambulatory Visit (INDEPENDENT_AMBULATORY_CARE_PROVIDER_SITE_OTHER): Payer: Medicare Other | Admitting: Gastroenterology

## 2022-10-08 ENCOUNTER — Encounter: Payer: Self-pay | Admitting: Gastroenterology

## 2022-10-08 VITALS — BP 152/68 | HR 68 | Temp 97.6°F | Ht 65.0 in | Wt 149.6 lb

## 2022-10-08 DIAGNOSIS — K7460 Unspecified cirrhosis of liver: Secondary | ICD-10-CM

## 2022-10-08 DIAGNOSIS — Z86010 Personal history of colonic polyps: Secondary | ICD-10-CM

## 2022-10-08 DIAGNOSIS — D649 Anemia, unspecified: Secondary | ICD-10-CM

## 2022-10-08 DIAGNOSIS — R188 Other ascites: Secondary | ICD-10-CM | POA: Diagnosis not present

## 2022-10-08 DIAGNOSIS — K219 Gastro-esophageal reflux disease without esophagitis: Secondary | ICD-10-CM

## 2022-10-08 NOTE — Patient Instructions (Addendum)
Cirrhosis Lifestyle Recommendations:  High-protein diet from a primarily plant-based diet. Avoid red meat.  No raw or undercooked meat, seafood, or shellfish. Low-fat/cholesterol/carbohydrate diet. Limit sodium to no more than 2000 mg/day including everything that you eat and drink. Recommend at least 30 minutes of aerobic and resistance exercise 3 days/week. Limit Tylenol to 2000 mg daily.   Continue taking the lactulose once daily.  We will plan to repeat your ultrasound and labs in May 2024.  I will see you in the office in 6 months, sooner if needed.  It was a pleasure to see you today. I want to create trusting relationships with patients. If you receive a survey regarding your visit,  I greatly appreciate you taking time to fill this out on paper or through your MyChart. I value your feedback.  Brooke Bonito, MSN, FNP-BC, AGACNP-BC [facility]

## 2022-11-04 ENCOUNTER — Telehealth: Payer: Self-pay | Admitting: *Deleted

## 2022-11-04 NOTE — Telephone Encounter (Signed)
Pt scheduled for EGD with Dr. Jena Gauss, ASA [phone]. Aware will send EGD/pre-op appt info.

## 2022-11-05 NOTE — Telephone Encounter (Signed)
Called and gave pre-op appt details. Voiced understanding.

## 2022-11-06 ENCOUNTER — Other Ambulatory Visit: Payer: Self-pay | Admitting: Gastroenterology

## 2022-11-06 ENCOUNTER — Telehealth: Payer: Self-pay | Admitting: *Deleted

## 2022-11-06 DIAGNOSIS — K219 Gastro-esophageal reflux disease without esophagitis: Secondary | ICD-10-CM

## 2022-11-06 MED ORDER — PANTOPRAZOLE SODIUM 40 MG PO TBEC: 40.0000 mg | DELAYED_RELEASE_TABLET | Freq: Two times a day (BID) | ORAL | 3 refills | Status: DC

## 2022-11-06 NOTE — Telephone Encounter (Signed)
Error

## 2022-11-06 NOTE — Telephone Encounter (Signed)
Pt called needs refill on pantoprazole 40mg . Last OV 10/08/2022

## 2022-11-27 NOTE — Patient Instructions (Addendum)
Nicole Barrett  11/27/2022     @PREFPERIOPPHARMACY @   Your procedure is scheduled on  12/03/2022.   Report to [facility] at  0920 A.M.   Call this number if you have problems the morning of surgery:  [phone]  If you experience any cold or flu symptoms such as cough, fever, chills, shortness of breath, etc. between now and your scheduled surgery, please notify us at the above number.   Remember:  Follow the diet and prep instructions given to you by the office.     Take these medicines the morning of surgery with A SIP OF WATER                   clonazepam, allegra, pantoprazole.     Do not wear jewelry, make-up or nail polish, including gel polish,  artificial nails, or any other type of covering on natural nails (fingers and  toes).  Do not wear lotions, powders, or perfumes, or deodorant.  Do not shave 48 hours prior to surgery.  Men may shave face and neck.  Do not bring valuables to the hospital.  [facility] is not responsible for any belongings or valuables.  Contacts, dentures or bridgework may not be worn into surgery.  Leave your suitcase in the car.  After surgery it may be brought to your room.  For patients admitted to the hospital, discharge time will be determined by your treatment team.  Patients discharged the day of surgery will not be allowed to drive home and must have someone with them for 24 hours.    Special instructions:   DO NOT smoke tobacco or vape for 24 hours before your procedure.  Please read over the following fact sheets that you were given. Anesthesia Post-op Instructions and Care and Recovery After Surgery        Upper Endoscopy, Adult, Care After After the procedure, it is common to have a sore throat. It is also common to have: Mild stomach pain or discomfort. Bloating. Nausea. Follow these instructions at home: The instructions below may help you care for yourself at home. Your health care provider may give you  more instructions. If you have questions, ask your health care provider. If you were given a sedative during the procedure, it can affect you for several hours. Do not drive or operate machinery until your health care provider says that it is safe. If you will be going home right after the procedure, plan to have a responsible adult: Take you home from the hospital or clinic. You will not be allowed to drive. Care for you for the time you are told. Follow instructions from your health care provider about what you may eat and drink. Return to your normal activities as told by your health care provider. Ask your health care provider what activities are safe for you. Take over-the-counter and prescription medicines only as told by your health care provider. Contact a health care provider if you: Have a sore throat that lasts longer than one day. Have trouble swallowing. Have a fever. Get help right away if you: Vomit blood or your vomit looks like coffee grounds. Have bloody, black, or tarry stools. Have a very bad sore throat or you cannot swallow. Have difficulty breathing or very bad pain in your chest or abdomen. These symptoms may be an emergency. Get help right away. Call 911. Do not wait to see if the symptoms will go away. Do  not drive yourself to the hospital. Summary After the procedure, it is common to have a sore throat, mild stomach discomfort, bloating, and nausea. If you were given a sedative during the procedure, it can affect you for several hours. Do not drive until your health care provider says that it is safe. Follow instructions from your health care provider about what you may eat and drink. Return to your normal activities as told by your health care provider. This information is not intended to replace advice given to you by your health care provider. Make sure you discuss any questions you have with your health care provider. Document Revised: 07/17/2021 Document  Reviewed: 07/17/2021 Elsevier Patient Education  2024 [employer] Monitored Anesthesia Care, Care After The following information offers guidance on how to care for yourself after your procedure. Your health care provider may also give you more specific instructions. If you have problems or questions, contact your health care provider. What can I expect after the procedure? After the procedure, it is common to have: Tiredness. Little or no memory about what happened during or after the procedure. Impaired judgment when it comes to making decisions. Nausea or vomiting. Some trouble with balance. Follow these instructions at home: For the time period you were told by your health care provider:  Rest. Do not participate in activities where you could fall or become injured. Do not drive or use machinery. Do not drink alcohol. Do not take sleeping pills or medicines that cause drowsiness. Do not make important decisions or sign legal documents. Do not take care of children on your own. Medicines Take over-the-counter and prescription medicines only as told by your health care provider. If you were prescribed antibiotics, take them as told by your health care provider. Do not stop using the antibiotic even if you start to feel better. Eating and drinking Follow instructions from your health care provider about what you may eat and drink. Drink enough fluid to keep your urine pale yellow. If you vomit: Drink clear fluids slowly and in small amounts as you are able. Clear fluids include water, ice chips, low-calorie sports drinks, and fruit juice that has water added to it (diluted fruit juice). Eat light and bland foods in small amounts as you are able. These foods include bananas, applesauce, rice, lean meats, toast, and crackers. General instructions  Have a responsible adult stay with you for the time you are told. It is important to have someone help care for you until you are awake  and alert. If you have sleep apnea, surgery and some medicines can increase your risk for breathing problems. Follow instructions from your health care provider about wearing your sleep device: When you are sleeping. This includes during daytime naps. While taking prescription pain medicines, sleeping medicines, or medicines that make you drowsy. Do not use any products that contain nicotine or tobacco. These products include cigarettes, chewing tobacco, and vaping devices, such as e-cigarettes. If you need help quitting, ask your health care provider. Contact a health care provider if: You feel nauseous or vomit every time you eat or drink. You feel light-headed. You are still sleepy or having trouble with balance after 24 hours. You get a rash. You have a fever. You have redness or swelling around the IV site. Get help right away if: You have trouble breathing. You have new confusion after you get home. These symptoms may be an emergency. Get help right away. Call 911. Do not wait to see if  the symptoms will go away. Do not drive yourself to the hospital. This information is not intended to replace advice given to you by your health care provider. Make sure you discuss any questions you have with your health care provider. Document Revised: 09/02/2021 Document Reviewed: 09/02/2021 Elsevier Patient Education  2024 [employer].

## 2022-12-02 ENCOUNTER — Encounter (HOSPITAL_COMMUNITY)
Admission: RE | Admit: 2022-12-02 | Discharge: 2022-12-02 | Disposition: A | Discharge status: Home / Self Care | Payer: Medicare Other | Source: Ambulatory Visit | Attending: Internal Medicine | Admitting: Internal Medicine

## 2022-12-02 ENCOUNTER — Encounter (HOSPITAL_COMMUNITY): Payer: Self-pay

## 2022-12-02 VITALS — BP 146/76 | HR 68 | Temp 97.6°F | Resp 18 | Ht 65.0 in | Wt 149.5 lb

## 2022-12-02 DIAGNOSIS — Z01818 Encounter for other preprocedural examination: Secondary | ICD-10-CM | POA: Diagnosis present

## 2022-12-02 DIAGNOSIS — Z01812 Encounter for preprocedural laboratory examination: Secondary | ICD-10-CM | POA: Insufficient documentation

## 2022-12-02 DIAGNOSIS — K760 Fatty (change of) liver, not elsewhere classified: Secondary | ICD-10-CM | POA: Diagnosis not present

## 2022-12-02 HISTORY — DX: Other specified postprocedural states: Z98.890

## 2022-12-02 LAB — PROTIME-INR
INR: 1.3 — ABNORMAL HIGH (ref 0.8–1.2)
Prothrombin Time: 16.4 seconds — ABNORMAL HIGH (ref 11.4–15.2)

## 2022-12-02 LAB — COMPREHENSIVE METABOLIC PANEL
ALT: 12 U/L (ref 0–44)
AST: 23 U/L (ref 15–41)
Albumin: 3.6 g/dL (ref 3.5–5.0)
Alkaline Phosphatase: 67 U/L (ref 38–126)
Anion gap: 9 (ref 5–15)
BUN: 7 mg/dL — ABNORMAL LOW (ref 8–23)
CO2: 23 mmol/L (ref 22–32)
Calcium: 8.9 mg/dL (ref 8.9–10.3)
Chloride: 106 mmol/L (ref 98–111)
Creatinine, Ser: 0.86 mg/dL (ref 0.44–1.00)
GFR, Estimated: >60 mL/min (ref >60)
Glucose, Bld: 165 mg/dL — ABNORMAL HIGH (ref 70–99)
Potassium: 3.9 mmol/L (ref 3.5–5.1)
Sodium: 138 mmol/L (ref 135–145)
Total Bilirubin: 1.1 mg/dL (ref 0.3–1.2)
Total Protein: 6.2 g/dL — ABNORMAL LOW (ref 6.5–8.1)

## 2022-12-02 LAB — CBC WITH DIFFERENTIAL/PLATELET
Abs Immature Granulocytes: 0.01 10*3/uL (ref 0.00–0.07)
Basophils Absolute: 0 10*3/uL (ref 0.0–0.1)
Basophils Relative: 1 %
Eosinophils Absolute: 0.2 10*3/uL (ref 0.0–0.5)
Eosinophils Relative: 5 %
HCT: 32.6 % — ABNORMAL LOW (ref 36.0–46.0)
Hemoglobin: 10.6 g/dL — ABNORMAL LOW (ref 12.0–15.0)
Immature Granulocytes: 0 %
Lymphocytes Relative: 27 %
Lymphs Abs: 1 10*3/uL (ref 0.7–4.0)
MCH: 33.2 pg (ref 26.0–34.0)
MCHC: 32.5 g/dL (ref 30.0–36.0)
MCV: 102.2 fL — ABNORMAL HIGH (ref 80.0–100.0)
Monocytes Absolute: 0.3 10*3/uL (ref 0.1–1.0)
Monocytes Relative: 7 %
Neutro Abs: 2.2 10*3/uL (ref 1.7–7.7)
Neutrophils Relative %: 60 %
Platelets: 84 10*3/uL — ABNORMAL LOW (ref 150–400)
RBC: 3.19 MIL/uL — ABNORMAL LOW (ref 3.87–5.11)
RDW: 13 % (ref 11.5–15.5)
WBC: 3.6 10*3/uL — ABNORMAL LOW (ref 4.0–10.5)
nRBC: 0 % (ref 0.0–0.2)

## 2022-12-03 ENCOUNTER — Ambulatory Visit (HOSPITAL_COMMUNITY)
Admission: RE | Admit: 2022-12-03 | Discharge: 2022-12-03 | Disposition: A | Discharge status: Home / Self Care | Payer: Medicare Other | Attending: Internal Medicine | Admitting: Internal Medicine

## 2022-12-03 ENCOUNTER — Ambulatory Visit (HOSPITAL_COMMUNITY): Payer: Medicare Other | Admitting: Certified Registered"

## 2022-12-03 ENCOUNTER — Ambulatory Visit (HOSPITAL_BASED_OUTPATIENT_CLINIC_OR_DEPARTMENT_OTHER): Payer: Medicare Other | Admitting: Certified Registered"

## 2022-12-03 ENCOUNTER — Encounter (HOSPITAL_COMMUNITY)
Admission: RE | Disposition: A | Discharge status: Home / Self Care | Payer: Self-pay | Source: Home / Self Care | Attending: Internal Medicine

## 2022-12-03 DIAGNOSIS — K766 Portal hypertension: Secondary | ICD-10-CM

## 2022-12-03 DIAGNOSIS — F419 Anxiety disorder, unspecified: Secondary | ICD-10-CM | POA: Insufficient documentation

## 2022-12-03 DIAGNOSIS — K3189 Other diseases of stomach and duodenum: Secondary | ICD-10-CM | POA: Insufficient documentation

## 2022-12-03 DIAGNOSIS — K219 Gastro-esophageal reflux disease without esophagitis: Secondary | ICD-10-CM | POA: Diagnosis not present

## 2022-12-03 DIAGNOSIS — Z87891 Personal history of nicotine dependence: Secondary | ICD-10-CM | POA: Insufficient documentation

## 2022-12-03 DIAGNOSIS — D696 Thrombocytopenia, unspecified: Secondary | ICD-10-CM | POA: Insufficient documentation

## 2022-12-03 DIAGNOSIS — K7460 Unspecified cirrhosis of liver: Secondary | ICD-10-CM

## 2022-12-03 DIAGNOSIS — Z79899 Other long term (current) drug therapy: Secondary | ICD-10-CM | POA: Diagnosis not present

## 2022-12-03 HISTORY — PX: ESOPHAGOGASTRODUODENOSCOPY (EGD) WITH PROPOFOL: SHX5813

## 2022-12-03 SURGERY — ESOPHAGOGASTRODUODENOSCOPY (EGD) WITH PROPOFOL
Anesthesia: General

## 2022-12-03 MED ORDER — LIDOCAINE HCL (CARDIAC) PF 100 MG/5ML IV SOSY
PREFILLED_SYRINGE | INTRAVENOUS | Status: DC | PRN
  Administered 2022-12-03: 100 mg via INTRAVENOUS

## 2022-12-03 MED ORDER — PROPOFOL 10 MG/ML IV BOLUS
INTRAVENOUS | Status: DC | PRN
  Administered 2022-12-03: 80 mg via INTRAVENOUS

## 2022-12-03 MED ORDER — PROPOFOL 500 MG/50ML IV EMUL
INTRAVENOUS | Status: DC | PRN
  Administered 2022-12-03: 125 ug/kg/min via INTRAVENOUS

## 2022-12-03 MED ORDER — LACTATED RINGERS IV SOLN: INTRAVENOUS | Status: DC

## 2022-12-03 NOTE — Progress Notes (Signed)
CRNA reported to this nurse that there was some bleeding noted in pt's mouth. Assessed pt's mouth and a small cut was noted under her tongue. Instructed pt it may be sore for a few days.

## 2022-12-03 NOTE — Op Note (Signed)
[facility] Patient Name: Nicole Barrett Procedure Date: 12/03/2022 11:45 AM MRN: [id number] Date of Birth: May 28, 1945 Attending MD: Gennette Pac , MD, [id number] CSN: [id number] Age: 77 Admit Type: Outpatient Procedure:                Upper GI endoscopy Indications:              Cirrhosis rule out esophageal varices Providers:                Gennette Pac, MD, Nena Polio, RN, Pandora Leiter, Technician Referring MD:              Medicines:                Propofol per Anesthesia Complications:            No immediate complications. Estimated Blood Loss:     Estimated blood loss: none. Procedure:                Pre-Anesthesia Assessment:                           - Prior to the procedure, a History and Physical                            was performed, and patient medications and                            allergies were reviewed. The patient's tolerance of                            previous anesthesia was also reviewed. The risks                            and benefits of the procedure and the sedation                            options and risks were discussed with the patient.                            All questions were answered, and informed consent                            was obtained. Prior Anticoagulants: The patient has                            taken no anticoagulant or antiplatelet agents. ASA                            Grade Assessment: III - A patient with severe                            systemic disease. After reviewing the risks and  benefits, the patient was deemed in satisfactory                            condition to undergo the procedure.                           After obtaining informed consent, the endoscope was                            passed under direct vision. Throughout the                            procedure, the patient's blood pressure, pulse, and                             oxygen saturations were monitored continuously. The                            [id number]-H190 ([id number]) scope was introduced through the                            mouth, and advanced to the second part of duodenum.                            The upper GI endoscopy was accomplished without                            difficulty. The patient tolerated the procedure                            well. Scope In: 11:57:45 AM Scope Out: 12:01:48 PM Total Procedure Duration: 0 hours 4 minutes 3 seconds  Findings:      The examined esophagus was normal.      Moderate portal hypertensive gastropathy was found in the entire       examined stomach. Associated polypoid mucosa as well (previously       biopsied?"benign) patent pylorus.      The duodenal bulb and second portion of the duodenum were normal. Impression:               - Normal esophagus.                           - Portal hypertensive gastropathy.                           - Normal duodenal bulb and second portion of the                            duodenum.                           - No specimens collected. Moderate Sedation:      Moderate (conscious) sedation was personally administered by an       anesthesia professional. The following parameters were monitored: oxygen       saturation, heart rate, blood  pressure, respiratory rate, EKG, adequacy       of pulmonary ventilation, and response to care. Recommendation:           - Patient has a contact number available for                            emergencies. The signs and symptoms of potential                            delayed complications were discussed with the                            patient. Return to normal activities tomorrow.                            Written discharge instructions were provided to the                            patient.                           - Advance diet as tolerated.                           - Continue present medications.                           -  Repeat upper endoscopy in 1.5 years for screening                            purposes.                           - Return to GI office in 4 months. Procedure Code(s):        --- Professional ---                           [phone], Esophagogastroduodenoscopy, flexible,                            transoral; diagnostic, including collection of                            specimen(s) by brushing or washing, when performed                            (separate procedure) Diagnosis Code(s):        --- Professional ---                           K76.6, Portal hypertension                           K31.89, Other diseases of stomach and duodenum                           K74.60, Unspecified cirrhosis of liver CPT  copyright 2022 American Medical Association. All rights reserved. The codes documented in this report are preliminary and upon coder review may  be revised to meet current compliance requirements. [name]. [name], MD Gennette Pac, MD 12/03/2022 12:12:44 PM This report has been signed electronically. Number of Addenda: 0

## 2022-12-03 NOTE — Discharge Instructions (Signed)
EGD Discharge instructions Please read the instructions outlined below and refer to this sheet in the next few weeks. These discharge instructions provide you with general information on caring for yourself after you leave the hospital. Your doctor may also give you specific instructions. While your treatment has been planned according to the most current medical practices available, unavoidable complications occasionally occur. If you have any problems or questions after discharge, please call your doctor. ACTIVITY You may resume your regular activity but move at a slower pace for the next 24 hours.  Take frequent rest periods for the next 24 hours.  Walking will help expel (get rid of) the air and reduce the bloated feeling in your abdomen.  No driving for 24 hours (because of the anesthesia (medicine) used during the test).  You may shower.  Do not sign any important legal documents or operate any machinery for 24 hours (because of the anesthesia used during the test).  NUTRITION Drink plenty of fluids.  You may resume your normal diet.  Begin with a light meal and progress to your normal diet.  Avoid alcoholic beverages for 24 hours or as instructed by your caregiver.  MEDICATIONS You may resume your normal medications unless your caregiver tells you otherwise.  WHAT YOU CAN EXPECT TODAY You may experience abdominal discomfort such as a feeling of fullness or "gas" pains.  FOLLOW-UP Your doctor will discuss the results of your test with you.  SEEK IMMEDIATE MEDICAL ATTENTION IF ANY OF THE FOLLOWING OCCUR: Excessive nausea (feeling sick to your stomach) and/or vomiting.  Severe abdominal pain and distention (swelling).  Trouble swallowing.  Temperature over 101 F (37.8 C).  Rectal bleeding or vomiting of blood.     no varicose veins found today.  You did have changes of the lining of your   Stomach that go with liver disease.    I recommend you return for repeat EGD to check for  varicose veins in a year and a half.  Office visit with Brooke Bonito in 3 to 4 months   at patient request, I called Doug at [phone] findings and recommendations

## 2022-12-03 NOTE — Transfer of Care (Signed)
Immediate Anesthesia Transfer of Care Note  Patient: Nicole Barrett  Procedure(s) Performed: ESOPHAGOGASTRODUODENOSCOPY (EGD) WITH PROPOFOL  Patient Location: Short Stay  Anesthesia Type:General  Level of Consciousness: awake and patient cooperative  Airway & Oxygen Therapy: Patient Spontanous Breathing and Patient connected to nasal cannula oxygen  Post-op Assessment: Report given to RN and Post -op Vital signs reviewed and stable  Post vital signs: Reviewed and stable  Last Vitals:  Vitals Value Taken Time  BP 122/51 12/03/22 1214  Temp 36.4 C 12/03/22 1214  Pulse 66 12/03/22 1216  Resp 11 12/03/22 1216  SpO2 100 % 12/03/22 1216  Vitals shown include unfiled device data.  Last Pain:  Vitals:   12/03/22 1214  TempSrc: Oral  PainSc: 0-No pain         Complications: No notable events documented.

## 2022-12-03 NOTE — H&P (Signed)
@LOGO @   Primary Care Physician:  Carylon Perches, MD Primary Gastroenterologist:  Dr. Jena Gauss  Pre-Procedure History & Physical: HPI:  Nicole Barrett is a 77 y.o. female here for  a screening EGD.  History of well compensated cirrhosis but thrombocytopenia.  Negative EGD 2015 (benign gastric polyps) .  No dysphagia. Past Medical History:  Diagnosis Date   Anxiety    Cirrhosis (HCC)    GERD (gastroesophageal reflux disease)    History of hiatal hernia    HOH (hard of hearing)    Macular degeneration    PONV (postoperative nausea and vomiting)     Past Surgical History:  Procedure Laterality Date   CATARACT EXTRACTION EXTRACAPSULAR Right 05/24/2021   Procedure: CATARACT EXTRACTION PHACO AND INTRAOCULAR LENS PLACEMENT (IOC);  Surgeon: Fabio Pierce, MD;  Location: [facility];  Service: Ophthalmology;  Laterality: Right;  CDE 12.48   CATARACT EXTRACTION W/PHACO Left 06/07/2021   Procedure: CATARACT EXTRACTION PHACO AND INTRAOCULAR LENS PLACEMENT (IOC);  Surgeon: Fabio Pierce, MD;  Location: [facility];  Service: Ophthalmology;  Laterality: Left;  CDE: 5.36   CHOLECYSTECTOMY  2011   Dr. Leticia Penna   COLONOSCOPY N/A 04/05/2014   WUJ:WJXBJYN diverticulosis,colonic polyps removed (TA), random colon biopsies negative. next TCS 03/2021   cyst removed from right hand     ESOPHAGOGASTRODUODENOSCOPY N/A 04/05/2014   WGN:FAOZHYQMVHQ schazkis ring/HH, abnormal gastric mucosa with gastric polyps (benign)    Prior to Admission medications   Medication Sig Start Date End Date Taking? Authorizing Provider  acetaminophen (TYLENOL) 325 MG tablet Take 650 mg by mouth every 6 (six) hours as needed.   Yes [provider]  clonazePAM (KLONOPIN) 0.5 MG tablet Take 0.25 mg by mouth daily. 11/30/18  Yes [provider]  diclofenac Sodium (VOLTAREN) 1 % GEL Apply 2 g topically daily as needed (arthritis pain).   Yes [provider]  fexofenadine (ALLEGRA) 180 MG tablet Take 180 mg by mouth  daily as needed for allergies (Spring/Fall).   Yes [provider]  fluticasone (FLONASE) 50 MCG/ACT nasal spray Place 2 sprays into both nostrils daily as needed for allergies. 03/12/22  Yes [provider]  lactulose (CHRONULAC) 10 GM/15ML solution Take 15 mLs (10 g total) by mouth daily. 03/29/22  Yes Terrilee Files, MD  pantoprazole (PROTONIX) 40 MG tablet Take 1 tablet (40 mg total) by mouth 2 (two) times daily. 11/06/22  Yes Aida Raider, NP    Allergies as of 11/04/2022 - Review Complete 10/08/2022  Allergen Reaction Noted   Codeine Nausea And Vomiting 03/14/2014    Family History  Problem Relation Age of Onset   Pancreatic cancer Mother    Diabetes Other    Heart disease Other    Colon cancer Neg Hx     Social History   Socioeconomic History   Marital status: Married    Spouse name: Not on file   Number of children: 1   Years of education: Not on file   Highest education level: Not on file  Occupational History   Not on file  Tobacco Use   Smoking status: Former    Current packs/day: 0.00    Average packs/day: 1.5 packs/day for 20.0 years (30.0 ttl pk-yrs)    Types: Cigarettes    Start date: 68    Quit date: 48    Years since quitting: 40.6    Passive exposure: Past   Smokeless tobacco: Never  Substance and Sexual Activity   Alcohol use: Not Currently  Alcohol/week: 14.0 standard drinks of alcohol    Types: 14 Glasses of wine per week   Drug use: No   Sexual activity: Not on file  Other Topics Concern   Not on file  Social History Narrative   Not on file   Social Determinants of Health   Financial Resource Strain: Not on file  Food Insecurity: Not on file  Transportation Needs: Not on file  Physical Activity: Not on file  Stress: Not on file  Social Connections: Not on file  Intimate Partner Violence: Not on file    Review of Systems: See HPI, otherwise negative ROS  Physical Exam: BP (!) 147/72   Pulse 64   Temp  98.1 F (36.7 C) (Oral)   Resp 14   Ht 5\' 5"  (1.651 m)   Wt 67.8 kg   SpO2 100%   BMI 24.87 kg/m  General:   Alert,  Well-developed, well-nourished, pleasant and cooperative in NAD Skin:  Intact without significant lesions or rashes. Eyes:  Sclera clear, no icterus.   Conjunctiva pink. Ears:  Normal auditory acuity. Nose:  No deformity, discharge,  or lesions. Mouth:  No deformity or lesions. Neck:  Supple; no masses or thyromegaly. No significant cervical adenopathy. Lungs:  Clear throughout to auscultation.   No wheezes, crackles, or rhonchi. No acute distress. Heart:  Regular rate and rhythm; no murmurs, clicks, rubs,  or gallops. Abdomen: Non-distended, normal bowel sounds.  Soft and nontender without appreciable mass or hepatosplenomegaly.  Pulses:  Normal pulses noted. Extremities:  Without clubbing or edema.  Impression/Plan:    77 year old lady with well compensated cirrhosis.  Mild thrombocytopenia no varices seen on 2015 EGD.  Here for updated screening.  No dysphagia.  I have offered the patient a < screening EGD per plan. The risks, benefits, limitations, alternatives and imponderables have been reviewed with the patient. Potential for esophageal dilation, biopsy, etc. have also been reviewed.  Questions have been answered. All parties agreeable.      Notice: This dictation was prepared with Dragon dictation along with smaller phrase technology. Any transcriptional errors that result from this process are unintentional and may not be corrected upon review.

## 2022-12-03 NOTE — Anesthesia Preprocedure Evaluation (Signed)
Anesthesia Evaluation  Patient identified by MRN, date of birth, ID band Patient awake    Reviewed: Allergy & Precautions, H&P , NPO status , Patient's Chart, lab work & pertinent test results  History of Anesthesia Complications (+) PONV and history of anesthetic complications  Airway Mallampati: III  TM Distance: >3 FB Neck ROM: Full  Mouth opening: Limited Mouth Opening  Dental  (+) Edentulous Upper, Edentulous Lower   Pulmonary neg pulmonary ROS, former smoker   Pulmonary exam normal breath sounds clear to auscultation       Cardiovascular Exercise Tolerance: Good negative cardio ROS Normal cardiovascular exam Rhythm:Regular Rate:Normal     Neuro/Psych  PSYCHIATRIC DISORDERS Anxiety     negative neurological ROS     GI/Hepatic hiatal hernia,GERD  Medicated and Controlled,,(+) Cirrhosis         Endo/Other  negative endocrine ROS    Renal/GU negative Renal ROS  negative genitourinary   Musculoskeletal negative musculoskeletal ROS (+)    Abdominal   Peds negative pediatric ROS (+)  Hematology negative hematology ROS (+)   Anesthesia Other Findings   Reproductive/Obstetrics negative OB ROS                             Anesthesia Physical Anesthesia Plan  ASA: 3  Anesthesia Plan: General   Post-op Pain Management: Minimal or no pain anticipated   Induction: Intravenous  PONV Risk Score and Plan: 0 and Propofol infusion  Airway Management Planned: Nasal Cannula and Natural Airway  Additional Equipment:   Intra-op Plan:   Post-operative Plan:   Informed Consent: I have reviewed the patients History and Physical, chart, labs and discussed the procedure including the risks, benefits and alternatives for the proposed anesthesia with the patient or authorized representative who has indicated his/her understanding and acceptance.     Dental advisory given  Plan Discussed  with: CRNA and Surgeon  Anesthesia Plan Comments:         Anesthesia Quick Evaluation

## 2022-12-03 NOTE — Anesthesia Postprocedure Evaluation (Signed)
Anesthesia Post Note  Patient: Lacara D Brook  Procedure(s) Performed: ESOPHAGOGASTRODUODENOSCOPY (EGD) WITH PROPOFOL  Patient location during evaluation: Phase II Anesthesia Type: General Level of consciousness: awake and alert and oriented Pain management: pain level controlled Vital Signs Assessment: post-procedure vital signs reviewed and stable Respiratory status: spontaneous breathing, nonlabored ventilation and respiratory function stable Cardiovascular status: blood pressure returned to baseline and stable Postop Assessment: no apparent nausea or vomiting Anesthetic complications: no  No notable events documented.   Last Vitals:  Vitals:   12/03/22 0940 12/03/22 1214  BP: (!) 147/72 (!) 122/51  Pulse: 64 71  Resp: 14 13  Temp: 36.7 C 36.4 C  SpO2: 100% 100%    Last Pain:  Vitals:   12/03/22 1214  TempSrc: Oral  PainSc: 0-No pain                 Leighann Amadon C Sarim Rothman

## 2022-12-05 ENCOUNTER — Encounter (HOSPITAL_COMMUNITY): Payer: Self-pay | Admitting: Internal Medicine

## 2023-02-02 ENCOUNTER — Encounter: Payer: Self-pay | Admitting: Internal Medicine

## 2023-02-16 ENCOUNTER — Telehealth: Payer: Self-pay | Admitting: *Deleted

## 2023-02-16 ENCOUNTER — Other Ambulatory Visit: Payer: Self-pay | Admitting: *Deleted

## 2023-02-16 DIAGNOSIS — R188 Other ascites: Secondary | ICD-10-CM

## 2023-02-16 NOTE — Telephone Encounter (Signed)
Pt's husband Leonette Most (on [place]) left vm stating received letter stating that is was time for his wife to have RUQ [place] and labs.

## 2023-02-16 NOTE — Telephone Encounter (Signed)
Charles informed that [place] is scheduled for Wednesday 02/25/23, arrive at 9:15 am to check in, NPO after midnight.

## 2023-02-17 ENCOUNTER — Other Ambulatory Visit: Payer: Self-pay | Admitting: *Deleted

## 2023-02-17 ENCOUNTER — Encounter: Payer: Self-pay | Admitting: *Deleted

## 2023-02-17 DIAGNOSIS — K760 Fatty (change of) liver, not elsewhere classified: Secondary | ICD-10-CM

## 2023-02-17 DIAGNOSIS — R188 Other ascites: Secondary | ICD-10-CM

## 2023-02-17 NOTE — Telephone Encounter (Signed)
Mailed lab requisitions. Sent a [employer] geologist.

## 2023-02-23 DIAGNOSIS — K760 Fatty (change of) liver, not elsewhere classified: Secondary | ICD-10-CM | POA: Diagnosis not present

## 2023-02-23 DIAGNOSIS — K7460 Unspecified cirrhosis of liver: Secondary | ICD-10-CM | POA: Diagnosis not present

## 2023-02-23 DIAGNOSIS — R188 Other ascites: Secondary | ICD-10-CM | POA: Diagnosis not present

## 2023-02-24 LAB — COMPREHENSIVE METABOLIC PANEL
AG Ratio: 1.8 (calc) (ref 1.0–2.5)
ALT: 10 U/L (ref 6–29)
AST: 22 U/L (ref 10–35)
Albumin: 4 g/dL (ref 3.6–5.1)
Alkaline phosphatase (APISO): 79 U/L (ref 37–153)
BUN: 7 mg/dL (ref 7–25)
CO2: 27 mmol/L (ref 20–32)
Calcium: 9.2 mg/dL (ref 8.6–10.4)
Chloride: 108 mmol/L (ref 98–110)
Creat: 0.82 mg/dL (ref 0.60–1.00)
Globulin: 2.2 g/dL (ref 1.9–3.7)
Glucose, Bld: 120 mg/dL — ABNORMAL HIGH (ref 65–99)
Potassium: 4.2 mmol/L (ref 3.5–5.3)
Sodium: 142 mmol/L (ref 135–146)
Total Bilirubin: 1 mg/dL (ref 0.2–1.2)
Total Protein: 6.2 g/dL (ref 6.1–8.1)

## 2023-02-24 LAB — PROTIME-INR
INR: 1.2 — ABNORMAL HIGH
Prothrombin Time: 12.4 s — ABNORMAL HIGH (ref 9.0–11.5)

## 2023-02-25 ENCOUNTER — Ambulatory Visit (HOSPITAL_COMMUNITY)
Admission: RE | Admit: 2023-02-25 | Discharge: 2023-02-25 | Disposition: A | Discharge status: Home / Self Care | Payer: Medicare Other | Source: Ambulatory Visit | Attending: Gastroenterology | Admitting: Gastroenterology

## 2023-02-25 DIAGNOSIS — K7460 Unspecified cirrhosis of liver: Secondary | ICD-10-CM | POA: Insufficient documentation

## 2023-02-25 DIAGNOSIS — N281 Cyst of kidney, acquired: Secondary | ICD-10-CM | POA: Diagnosis not present

## 2023-02-25 DIAGNOSIS — K7689 Other specified diseases of liver: Secondary | ICD-10-CM | POA: Diagnosis not present

## 2023-02-25 DIAGNOSIS — R188 Other ascites: Secondary | ICD-10-CM | POA: Diagnosis not present

## 2023-03-09 ENCOUNTER — Encounter: Payer: Self-pay | Admitting: Gastroenterology

## 2023-03-09 DIAGNOSIS — B078 Other viral warts: Secondary | ICD-10-CM | POA: Diagnosis not present

## 2023-03-09 DIAGNOSIS — X32XXXD Exposure to sunlight, subsequent encounter: Secondary | ICD-10-CM | POA: Diagnosis not present

## 2023-03-09 DIAGNOSIS — L821 Other seborrheic keratosis: Secondary | ICD-10-CM | POA: Diagnosis not present

## 2023-03-09 DIAGNOSIS — L570 Actinic keratosis: Secondary | ICD-10-CM | POA: Diagnosis not present

## 2023-04-04 ENCOUNTER — Ambulatory Visit
Admission: EM | Admit: 2023-04-04 | Discharge: 2023-04-04 | Disposition: A | Discharge status: Home / Self Care | Payer: Medicare Other | Attending: Family Medicine | Admitting: Family Medicine

## 2023-04-04 ENCOUNTER — Ambulatory Visit: Payer: Medicare Other

## 2023-04-04 DIAGNOSIS — J22 Unspecified acute lower respiratory infection: Secondary | ICD-10-CM

## 2023-04-04 DIAGNOSIS — R0602 Shortness of breath: Secondary | ICD-10-CM | POA: Diagnosis not present

## 2023-04-04 MED ORDER — ALBUTEROL SULFATE HFA 108 (90 BASE) MCG/ACT IN AERS: 2.0000 | INHALATION_SPRAY | RESPIRATORY_TRACT | 0 refills | Status: DC | PRN

## 2023-04-04 MED ORDER — DOXYCYCLINE HYCLATE 100 MG PO CAPS: 100.0000 mg | ORAL_CAPSULE | Freq: Two times a day (BID) | ORAL | 0 refills | Status: DC

## 2023-04-04 MED ORDER — BENZONATATE 100 MG PO CAPS: 100.0000 mg | ORAL_CAPSULE | Freq: Three times a day (TID) | ORAL | 0 refills | Status: DC

## 2023-04-04 NOTE — Discharge Instructions (Signed)
We will call if anything comes back on the chest x-ray that we did not already discussed.  We are treating for pneumonia today.  Follow-up with primary care for a recheck next week.  Return sooner if worsening symptoms.

## 2023-04-04 NOTE — ED Triage Notes (Signed)
Pt states cough, SOB for the past 4 days.  Pt sob with exertion.

## 2023-04-05 NOTE — ED Provider Notes (Signed)
[facility]    CSN: [id number] Arrival date & time: 04/04/23  1512      History   Chief Complaint Chief Complaint  Patient presents with   Shortness of Breath    HPI Nicole Barrett is a 77 y.o. female.   Patient presenting today with 4-day history of cough, worsening shortness of breath, chest tightness.  Denies fever, chills, chest pain, abdominal pain, nausea vomiting or diarrhea.  So far trying over-the-counter remedies with minimal relief.  No known chronic pulmonary disease or cardiac issues per patient.    Past Medical History:  Diagnosis Date   Anxiety    Cirrhosis (HCC)    GERD (gastroesophageal reflux disease)    History of hiatal hernia    HOH (hard of hearing)    Macular degeneration    PONV (postoperative nausea and vomiting)     Patient Active Problem List   Diagnosis Date Noted   Non-alcoholic fatty liver disease 07/05/2014   History of colonic polyps    Gastric polyp    Chronic diarrhea 03/14/2014   GERD (gastroesophageal reflux disease) 03/14/2014   Rectal bleeding 03/14/2014   Dyspepsia 03/14/2014    Past Surgical History:  Procedure Laterality Date   CATARACT EXTRACTION EXTRACAPSULAR Right 05/24/2021   Procedure: CATARACT EXTRACTION PHACO AND INTRAOCULAR LENS PLACEMENT (IOC);  Surgeon: Fabio Pierce, MD;  Location: [facility];  Service: Ophthalmology;  Laterality: Right;  CDE 12.48   CATARACT EXTRACTION W/PHACO Left 06/07/2021   Procedure: CATARACT EXTRACTION PHACO AND INTRAOCULAR LENS PLACEMENT (IOC);  Surgeon: Fabio Pierce, MD;  Location: [facility];  Service: Ophthalmology;  Laterality: Left;  CDE: 5.36   CHOLECYSTECTOMY  2011   Dr. Leticia Penna   COLONOSCOPY N/A 04/05/2014   XBM:[id number] diverticulosis,colonic polyps removed (TA), random colon biopsies negative. next TCS 03/2021   cyst removed from right hand     ESOPHAGOGASTRODUODENOSCOPY N/A 04/05/2014   WNU:[id number] schazkis ring/HH, abnormal gastric mucosa with gastric polyps  (benign)   ESOPHAGOGASTRODUODENOSCOPY (EGD) WITH PROPOFOL N/A 12/03/2022   Procedure: ESOPHAGOGASTRODUODENOSCOPY (EGD) WITH PROPOFOL;  Surgeon: Corbin Ade, MD;  Location: [facility];  Service: Endoscopy;  Laterality: N/A;  1115am, asa 3, pt does not want to move up    OB History   No obstetric history on file.      Home Medications    Prior to Admission medications   Medication Sig Start Date End Date Taking? Authorizing Provider  albuterol (VENTOLIN HFA) 108 (90 Base) MCG/ACT inhaler Inhale 2 puffs into the lungs every 4 (four) hours as needed. 04/04/23  Yes Particia Nearing, PA-C  benzonatate (TESSALON) 100 MG capsule Take 1 capsule (100 mg total) by mouth every 8 (eight) hours. 04/04/23  Yes Particia Nearing, PA-C  doxycycline (VIBRAMYCIN) 100 MG capsule Take 1 capsule (100 mg total) by mouth 2 (two) times daily. 04/04/23  Yes Particia Nearing, PA-C  acetaminophen (TYLENOL) 325 MG tablet Take 650 mg by mouth every 6 (six) hours as needed.    [provider]  clonazePAM (KLONOPIN) 0.5 MG tablet Take 0.25 mg by mouth daily. 11/30/18   [provider]  diclofenac Sodium (VOLTAREN) 1 % GEL Apply 2 g topically daily as needed (arthritis pain).    [provider]  fexofenadine (ALLEGRA) 180 MG tablet Take 180 mg by mouth daily as needed for allergies (Spring/Fall).    [provider]  fluticasone (FLONASE) 50 MCG/ACT nasal spray Place 2 sprays into both nostrils daily as needed for allergies. 03/12/22  [provider]  lactulose (CHRONULAC) 10 GM/15ML solution Take 15 mLs (10 g total) by mouth daily. 03/29/22   Terrilee Files, MD  pantoprazole (PROTONIX) 40 MG tablet Take 1 tablet (40 mg total) by mouth 2 (two) times daily. 11/06/22   Aida Raider, NP    Family History Family History  Problem Relation Age of Onset   Pancreatic cancer Mother    Diabetes Other    Heart disease Other    Colon cancer Neg Hx      Social History Social History   Tobacco Use   Smoking status: Former    Current packs/day: 0.00    Average packs/day: 1.5 packs/day for 20.0 years (30.0 ttl pk-yrs)    Types: Cigarettes    Start date: 1964    Quit date: 1984    Years since quitting: 40.9    Passive exposure: Past   Smokeless tobacco: Never  Substance Use Topics   Alcohol use: Not Currently    Alcohol/week: 14.0 standard drinks of alcohol    Types: 14 Glasses of wine per week   Drug use: No     Allergies   Codeine and Mucinex [guaifenesin er]   Review of Systems Review of Systems Per HPI  Physical Exam Triage Vital Signs ED Triage Vitals  Encounter Vitals Group     BP 04/04/23 1517 133/69     Systolic BP Percentile --      Diastolic BP Percentile --      Pulse Rate 04/04/23 1517 84     Resp 04/04/23 1517 20     Temp 04/04/23 1517 (!) 97.5 F (36.4 C)     Temp Source 04/04/23 1517 Oral     SpO2 04/04/23 1517 98 %     Weight --      Height --      Head Circumference --      Peak Flow --      Pain Score 04/04/23 1520 0     Pain Loc --      Pain Education --      Exclude from Growth Chart --    No data found.  Updated Vital Signs BP 133/69 (BP Location: Right Arm)   Pulse 84   Temp (!) 97.5 F (36.4 C) (Oral)   Resp 20   SpO2 98%   Visual Acuity Right Eye Distance:   Left Eye Distance:   Bilateral Distance:    Right Eye Near:   Left Eye Near:    Bilateral Near:     Physical Exam Vitals and nursing note reviewed.  Constitutional:      Appearance: Normal appearance.  HENT:     Head: Atraumatic.     Right Ear: Tympanic membrane and external ear normal.     Left Ear: Tympanic membrane and external ear normal.     Nose: Rhinorrhea present.     Mouth/Throat:     Mouth: Mucous membranes are moist.     Pharynx: Posterior oropharyngeal erythema present.  Eyes:     Extraocular Movements: Extraocular movements intact.     Conjunctiva/sclera: Conjunctivae normal.   Cardiovascular:     Rate and Rhythm: Normal rate and regular rhythm.     Heart sounds: Normal heart sounds.  Pulmonary:     Effort: Pulmonary effort is normal.     Breath sounds: Rales present. No wheezing.  Musculoskeletal:        General: Normal range of motion.     Cervical back: Normal  range of motion and neck supple.  Skin:    General: Skin is warm and dry.  Neurological:     Mental Status: She is alert and oriented to person, place, and time.  Psychiatric:        Mood and Affect: Mood normal.        Thought Content: Thought content normal.      UC Treatments / Results  Labs (all labs ordered are listed, but only abnormal results are displayed) Labs Reviewed - No data to display  EKG   Radiology DG Chest 2 View Result Date: 04/04/2023 CLINICAL DATA:  Shortness of breath EXAM: CHEST - 2 VIEW COMPARISON:  05/23/2022 FINDINGS: The heart size and mediastinal contours are within normal limits. Both lungs are clear. The visualized skeletal structures are unremarkable. IMPRESSION: No active cardiopulmonary disease. Electronically Signed   By: Duanne Guess D.O.   On: 04/04/2023 16:52    Procedures Procedures (including critical care time)  Medications Ordered in UC Medications - No data to display  Initial Impression / Assessment and Plan / UC Course  I have reviewed the triage vital signs and the nursing notes.  Pertinent labs & imaging results that were available during my care of the patient were reviewed by me and considered in my medical decision making (see chart for details).     Vital signs and exam very reassuring today and she appears well and in no acute distress.  Possible crackles on exam and given severity of symptoms will cover for developing pneumonia with doxycycline, Tessalon, albuterol as needed.  Chest x-ray today was negative for acute pneumonia.  Discussed supportive over-the-counter medications, home care and strict return precautions for  worsening symptoms. Final Clinical Impressions(s) / UC Diagnoses   Final diagnoses:  Lower respiratory infection     Discharge Instructions      We will call if anything comes back on the chest x-ray that we did not already discussed.  We are treating for pneumonia today.  Follow-up with primary care for a recheck next week.  Return sooner if worsening symptoms.    ED Prescriptions     Medication Sig Dispense Auth. Provider   doxycycline (VIBRAMYCIN) 100 MG capsule Take 1 capsule (100 mg total) by mouth 2 (two) times daily. 14 capsule Particia Nearing, PA-C   benzonatate (TESSALON) 100 MG capsule Take 1 capsule (100 mg total) by mouth every 8 (eight) hours. 21 capsule Particia Nearing, [place]   albuterol (VENTOLIN HFA) 108 (90 Base) MCG/ACT inhaler Inhale 2 puffs into the lungs every 4 (four) hours as needed. 18 g Particia Nearing, [place]      PDMP not reviewed this encounter.   Particia Nearing, [place] 04/05/23 1525

## 2023-05-01 ENCOUNTER — Ambulatory Visit: Payer: Medicare Other | Admitting: Internal Medicine

## 2023-05-15 ENCOUNTER — Other Ambulatory Visit: Payer: Self-pay | Admitting: *Deleted

## 2023-05-15 ENCOUNTER — Ambulatory Visit: Payer: Medicare Other | Admitting: Internal Medicine

## 2023-05-15 VITALS — BP 159/79 | HR 81 | Temp 97.2°F | Ht 65.0 in | Wt 153.0 lb

## 2023-05-15 DIAGNOSIS — K7460 Unspecified cirrhosis of liver: Secondary | ICD-10-CM

## 2023-05-15 DIAGNOSIS — D649 Anemia, unspecified: Secondary | ICD-10-CM

## 2023-05-15 DIAGNOSIS — K760 Fatty (change of) liver, not elsewhere classified: Secondary | ICD-10-CM

## 2023-05-15 DIAGNOSIS — F1091 Alcohol use, unspecified, in remission: Secondary | ICD-10-CM | POA: Diagnosis not present

## 2023-05-15 DIAGNOSIS — K219 Gastro-esophageal reflux disease without esophagitis: Secondary | ICD-10-CM

## 2023-05-15 NOTE — Addendum Note (Signed)
Addended by: Elinor Dodge on: 05/15/2023 10:54 AM   Modules accepted: Orders

## 2023-05-15 NOTE — Patient Instructions (Addendum)
It was good to see you again today!  Can you pantoprazole 40 mg twice daily best taken before breakfast and supper.  As discussed, last intake of food or drink should be about 3 hours before going to bed  Would avoid over-the-counter sleep aids as much as possible this could affect your mental clarity  Would be best to avoid alcohol entirely including 1  Prescription for Carafate 1 g slurry take 1 g nightly just before bedtime dispense 30 with 11 refills.  Consider getting some aerobic exercise as feasible about 3 times weekly, brisk walking etc.  Continue lactulose.  Will want to avoid constipation our goal is about 3 bowel movements daily.  Office visit here in 6 months with labs c-Met, CBC, INR and AFP  If you have any problems in the meantime please do not hesitate to give me a call.

## 2023-05-15 NOTE — Progress Notes (Unsigned)
Primary Care Physician:  Carylon Perches, MD Primary Gastroenterologist:  Dr. Jena Gauss  Pre-Procedure History & Physical: HPI:  Nicole Barrett is a 78 y.o. female here for follow-up MASLD/well compensated cirrhosis MELD 3.0 10 in November of last year.  No varices on EGD last year will be due for repeat endoscopy in 1 year from now.  Has 2-3 bowel movements daily.  Some nocturnal reflux symptoms but has a tendency of eating and drinking right up until bedtime.  Wants to have some wine at bedtime asks if that will be permissible.  Takes various over-the-counter sleep aids.  Also takes clonazepam at night.  Son states she is foggy in the morning.  She is aged out of surveillance colonoscopies.  Weight up 4 pounds but wearing winter clothes.  Received hepatitis AB vaccine last year.  Past Medical History:  Diagnosis Date   Anxiety    Cirrhosis (HCC)    GERD (gastroesophageal reflux disease)    History of hiatal hernia    HOH (hard of hearing)    Macular degeneration    PONV (postoperative nausea and vomiting)     Past Surgical History:  Procedure Laterality Date   CATARACT EXTRACTION EXTRACAPSULAR Right 05/24/2021   Procedure: CATARACT EXTRACTION PHACO AND INTRAOCULAR LENS PLACEMENT (IOC);  Surgeon: Fabio Pierce, MD;  Location: [facility];  Service: Ophthalmology;  Laterality: Right;  CDE 12.48   CATARACT EXTRACTION W/PHACO Left 06/07/2021   Procedure: CATARACT EXTRACTION PHACO AND INTRAOCULAR LENS PLACEMENT (IOC);  Surgeon: Fabio Pierce, MD;  Location: [facility];  Service: Ophthalmology;  Laterality: Left;  CDE: 5.36   CHOLECYSTECTOMY  2011   Dr. Leticia Penna   COLONOSCOPY N/A 04/05/2014   XLK:[id number] diverticulosis,colonic polyps removed (TA), random colon biopsies negative. next TCS 03/2021   cyst removed from right hand     ESOPHAGOGASTRODUODENOSCOPY N/A 04/05/2014   OZD:[id number] schazkis ring/HH, abnormal gastric mucosa with gastric polyps (benign)   ESOPHAGOGASTRODUODENOSCOPY (EGD)  WITH PROPOFOL N/A 12/03/2022   Procedure: ESOPHAGOGASTRODUODENOSCOPY (EGD) WITH PROPOFOL;  Surgeon: Corbin Ade, MD;  Location: [facility];  Service: Endoscopy;  Laterality: N/A;  1115am, asa 3, pt does not want to move up    Prior to Admission medications   Medication Sig Start Date End Date Taking? Authorizing Provider  acetaminophen (TYLENOL) 325 MG tablet Take 650 mg by mouth every 6 (six) hours as needed.   Yes [provider]  albuterol (VENTOLIN HFA) 108 (90 Base) MCG/ACT inhaler Inhale 2 puffs into the lungs every 4 (four) hours as needed. 04/04/23  Yes Particia Nearing, PA-C  clonazePAM (KLONOPIN) 0.5 MG tablet Take 0.25 mg by mouth daily. 11/30/18  Yes [provider]  diclofenac Sodium (VOLTAREN) 1 % GEL Apply 2 g topically daily as needed (arthritis pain).   Yes [provider]  fexofenadine (ALLEGRA) 180 MG tablet Take 180 mg by mouth daily as needed for allergies (Spring/Fall).   Yes [provider]  fluticasone (FLONASE) 50 MCG/ACT nasal spray Place 2 sprays into both nostrils daily as needed for allergies. 03/12/22  Yes [provider]  lactulose (CHRONULAC) 10 GM/15ML solution Take 15 mLs (10 g total) by mouth daily. 03/29/22  Yes Terrilee Files, MD  pantoprazole (PROTONIX) 40 MG tablet Take 1 tablet (40 mg total) by mouth 2 (two) times daily. 11/06/22  Yes [place]on, Frederik Schmidt, NP  benzonatate (TESSALON) 100 MG capsule Take 1 capsule (100 mg total) by mouth every 8 (eight) hours. Patient not taking: Reported on 05/15/2023  04/04/23   Particia Nearing, PA-C    Allergies as of 05/15/2023 - Review Complete 05/15/2023  Allergen Reaction Noted   Codeine Nausea And Vomiting 03/14/2014   Mucinex [guaifenesin er] Nausea And Vomiting 04/04/2023    Family History  Problem Relation Age of Onset   Pancreatic cancer Mother    Diabetes Other    Heart disease Other    Colon cancer Neg Hx     Social History   Socioeconomic  History   Marital status: Married    Spouse name: Not on file   Number of children: 1   Years of education: Not on file   Highest education level: Not on file  Occupational History   Not on file  Tobacco Use   Smoking status: Former    Current packs/day: 0.00    Average packs/day: 1.5 packs/day for 20.0 years (30.0 ttl pk-yrs)    Types: Cigarettes    Start date: 24    Quit date: 21    Years since quitting: 41.0    Passive exposure: Past   Smokeless tobacco: Never  Substance and Sexual Activity   Alcohol use: Not Currently    Alcohol/week: 14.0 standard drinks of alcohol    Types: 14 Glasses of wine per week   Drug use: No   Sexual activity: Not on file  Other Topics Concern   Not on file  Social History Narrative   Not on file   Social Drivers of Health   Financial Resource Strain: Not on file  Food Insecurity: Not on file  Transportation Needs: Not on file  Physical Activity: Not on file  Stress: Not on file  Social Connections: Not on file  Intimate Partner Violence: Not on file    Review of Systems: See HPI, otherwise negative ROS  Physical Exam: BP (!) 168/68 (BP Location: Right Arm, Patient Position: Sitting, Cuff Size: Normal)   Pulse 81   Temp (!) 97.2 F (36.2 C) (Temporal)   Ht 5\' 5"  (1.651 m)   Wt 153 lb (69.4 kg)   BMI 25.46 kg/m  General:   Alert,  Well-developed, well-nourished, pleasant and cooperative in NAD.  Accompanied by her son.  Well oriented conversant  neck:  Supple; no masses or thyromegaly. No significant cervical adenopathy. Lungs:  Clear throughout to auscultation.   No wheezes, crackles, or rhonchi. No acute distress. Heart:  Regular rate and rhythm; no murmurs, clicks, rubs,  or gallops. Abdomen: Non-distended, normal bowel sounds.  Soft and nontender without appreciable mass or hepatosplenomegaly.   Impression/Plan: 78 year old lady with mass o/well compensated MELD 3.0 10 in November of last year.  Flare of GERD periodically  with dietary indiscretion no alarm symptoms.  Goal dose with lactulose.  Voiced concerns about over-the-counter sleep aids containing diphenhydramine.  Nicole Barrett appears to be her major overall problem at this time.  She does take clonazepam at bedtime.  She will be due for a surveillance EGD about 1 year from now.  Updated MELD labs in 6 months  History of regular wine consumption earlier in her life she asked if she can have 1 glass of wine at bedtime   Recommendations:  Can you pantoprazole 40 mg twice daily best taken before breakfast and supper.  As discussed, last intake of food or drink should be about 3 hours before going to bed  Would avoid over-the-counter sleep aids as much as possible this could affect your mental clarity  Would be best to avoid alcohol entirely including wine  Prescription for Carafate 1 g slurry take 1 g nightly just before bedtime dispense 30 with 11 refills.  Consider getting some aerobic exercise as feasible about 3 times weekly, brisk walking etc.  Continue lactulose.  Will want to avoid constipation our goal is about 3 bowel movements daily.  Office visit here in 6 months with labs c-Met, CBC, INR and AFP  If you have any problems in the meantime please do not hesitate to give me a call.  Notice: This dictation was prepared with Dragon dictation along with smaller phrase technology. Any transcriptional errors that result from this process are unintentional and may not be corrected upon review.

## 2023-05-15 NOTE — Progress Notes (Signed)
ORDERED LABS

## 2023-07-09 ENCOUNTER — Telehealth: Payer: Self-pay

## 2023-07-09 NOTE — Telephone Encounter (Signed)
 Pt is requesting refills on lactulose. Pt would like that sent to [facility] [place].

## 2023-07-10 ENCOUNTER — Other Ambulatory Visit: Payer: Self-pay

## 2023-07-10 MED ORDER — LACTULOSE 10 GM/15ML PO SOLN: 10.0000 g | Freq: Every day | ORAL | 3 refills | Status: DC

## 2023-07-10 NOTE — Telephone Encounter (Signed)
 Rx sent to requested pharmacy, pt is aware.

## 2023-09-17 DIAGNOSIS — H04123 Dry eye syndrome of bilateral lacrimal glands: Secondary | ICD-10-CM | POA: Diagnosis not present

## 2023-09-17 DIAGNOSIS — H01001 Unspecified blepharitis right upper eyelid: Secondary | ICD-10-CM | POA: Diagnosis not present

## 2023-09-17 DIAGNOSIS — H01002 Unspecified blepharitis right lower eyelid: Secondary | ICD-10-CM | POA: Diagnosis not present

## 2023-09-17 DIAGNOSIS — H26493 Other secondary cataract, bilateral: Secondary | ICD-10-CM | POA: Diagnosis not present

## 2023-10-24 ENCOUNTER — Other Ambulatory Visit: Payer: Self-pay | Admitting: Gastroenterology

## 2023-10-24 DIAGNOSIS — K219 Gastro-esophageal reflux disease without esophagitis: Secondary | ICD-10-CM

## 2023-10-29 DIAGNOSIS — H26492 Other secondary cataract, left eye: Secondary | ICD-10-CM | POA: Diagnosis not present

## 2023-11-06 ENCOUNTER — Emergency Department (HOSPITAL_COMMUNITY)
Admission: EM | Admit: 2023-11-06 | Discharge: 2023-11-06 | Disposition: A | Discharge status: Home / Self Care | Attending: Emergency Medicine | Admitting: Emergency Medicine

## 2023-11-06 ENCOUNTER — Emergency Department (HOSPITAL_COMMUNITY)

## 2023-11-06 DIAGNOSIS — M25521 Pain in right elbow: Secondary | ICD-10-CM | POA: Diagnosis not present

## 2023-11-06 DIAGNOSIS — R220 Localized swelling, mass and lump, head: Secondary | ICD-10-CM | POA: Diagnosis not present

## 2023-11-06 DIAGNOSIS — I672 Cerebral atherosclerosis: Secondary | ICD-10-CM | POA: Diagnosis not present

## 2023-11-06 DIAGNOSIS — S50311A Abrasion of right elbow, initial encounter: Secondary | ICD-10-CM | POA: Diagnosis not present

## 2023-11-06 DIAGNOSIS — S59901A Unspecified injury of right elbow, initial encounter: Secondary | ICD-10-CM | POA: Insufficient documentation

## 2023-11-06 DIAGNOSIS — I6782 Cerebral ischemia: Secondary | ICD-10-CM | POA: Diagnosis not present

## 2023-11-06 DIAGNOSIS — S0990XA Unspecified injury of head, initial encounter: Secondary | ICD-10-CM | POA: Diagnosis present

## 2023-11-06 DIAGNOSIS — S0081XA Abrasion of other part of head, initial encounter: Secondary | ICD-10-CM | POA: Diagnosis not present

## 2023-11-06 DIAGNOSIS — S0181XA Laceration without foreign body of other part of head, initial encounter: Secondary | ICD-10-CM | POA: Insufficient documentation

## 2023-11-06 DIAGNOSIS — W010XXA Fall on same level from slipping, tripping and stumbling without subsequent striking against object, initial encounter: Secondary | ICD-10-CM | POA: Insufficient documentation

## 2023-11-06 DIAGNOSIS — S59801A Other specified injuries of right elbow, initial encounter: Secondary | ICD-10-CM | POA: Diagnosis not present

## 2023-11-06 DIAGNOSIS — W19XXXA Unspecified fall, initial encounter: Secondary | ICD-10-CM

## 2023-11-06 NOTE — ED Notes (Signed)
 Patient transported to CT

## 2023-11-06 NOTE — ED Triage Notes (Signed)
 Pt come in after a fall. Pt was in the laundry room felt dizzy and passed out. Pt has a slight laceration above right eye (bleeding controlled). Pt also has a right elbow pain. Pt ia A&Ox4. In triage. Pt is not on blood thinners.

## 2023-11-06 NOTE — ED Notes (Signed)
 ED Provider at bedside.

## 2023-11-06 NOTE — Discharge Instructions (Addendum)
 Your CT scan did not show any bleeding or broken bones.  Keep the wound clean with soap and water .  Your right elbow x-ray does not show any obvious broken bones but does have fluid in the joint which can be a secondary sign of what is called an occult fracture.  Due to this we are putting you in a sling and you need to wear this until you are seen by your orthopedist in 1-2 weeks.  If you develop new or worsening headache, weakness or numbness in the arm, new or worsening or uncontrolled pain or any other new/concerning symptoms then return to the ER.

## 2023-11-06 NOTE — ED Provider Notes (Signed)
 [facility] AT [facility] Provider Note   CSN: [id number] Arrival date & time: 11/06/23  1829     Patient presents with: Nicole Barrett is a 78 y.o. female.   HPI 78 year old female presents with a fall.  She was in the laundry room and got scared by thunder and thinks she tripped herself on her feet when she was startled and fell.  Hit the floor and sustained a right sided head/eye injury as well as injuring her right elbow.  Does not think she had any LOC.  No current or preceding dizziness.  No vision changes, neck pain, weakness or numbness.  No syncope.  Last Tdap was last year.  Prior to Admission medications   Medication Sig Start Date End Date Taking? Authorizing Provider  acetaminophen (TYLENOL) 325 MG tablet Take 650 mg by mouth every 6 (six) hours as needed.    [provider]  albuterol  (VENTOLIN  HFA) 108 (90 Base) MCG/ACT inhaler Inhale 2 puffs into the lungs every 4 (four) hours as needed. 04/04/23   Stuart Vernell Norris, PA-C  benzonatate  (TESSALON ) 100 MG capsule Take 1 capsule (100 mg total) by mouth every 8 (eight) hours. Patient not taking: Reported on 05/15/2023 04/04/23   Stuart Vernell Norris, PA-C  clonazePAM (KLONOPIN) 0.5 MG tablet Take 0.25 mg by mouth daily. 11/30/18   [provider]  diclofenac Sodium (VOLTAREN) 1 % GEL Apply 2 g topically daily as needed (arthritis pain).    [provider]  fexofenadine (ALLEGRA) 180 MG tablet Take 180 mg by mouth daily as needed for allergies (Spring/Fall).    [provider]  fluticasone (FLONASE) 50 MCG/ACT nasal spray Place 2 sprays into both nostrils daily as needed for allergies. 03/12/22   [provider]  lactulose  (CHRONULAC ) 10 GM/15ML solution Take 15 mLs (10 g total) by mouth daily. 07/10/23   Rourk, Lamar HERO, MD  pantoprazole  (PROTONIX ) 40 MG tablet TAKE 1 TABLET(40 MG) BY MOUTH TWICE DAILY 10/26/23   Rourk, Lamar HERO, MD     Allergies: Codeine and Mucinex [guaifenesin er]    Review of Systems  Eyes:  Negative for visual disturbance.  Musculoskeletal:  Positive for arthralgias. Negative for neck pain.  Neurological:  Positive for headaches. Negative for syncope, weakness, light-headedness and numbness.    Updated Vital Signs BP (!) 150/69 (BP Location: Left Arm)   Pulse 70   Temp 98 F (36.7 C) (Oral)   Resp 17   Ht 5' 5 (1.651 m)   Wt 69.4 kg   SpO2 97%   BMI 25.46 kg/m   Physical Exam Vitals and nursing note reviewed.  Constitutional:      General: She is not in acute distress.    Appearance: She is well-developed. She is not ill-appearing or diaphoretic.  HENT:     Head: Normocephalic. Laceration present.   Eyes:     Extraocular Movements: Extraocular movements intact.  Cardiovascular:     Rate and Rhythm: Normal rate and regular rhythm.     Pulses:          Radial pulses are 2+ on the right side.     Heart sounds: Normal heart sounds.  Pulmonary:     Effort: Pulmonary effort is normal.     Breath sounds: Normal breath sounds.  Abdominal:     Palpations: Abdomen is soft.     Tenderness: There is no abdominal tenderness.  Musculoskeletal:     Right shoulder: No  tenderness.     Right upper arm: No tenderness.     Right elbow: No deformity or lacerations. Decreased range of motion. Tenderness present.     Right forearm: No tenderness.     Cervical back: No spinous process tenderness or muscular tenderness.     Comments: Grossly normal sensation in right hand. Normal grip strength.  Skin:    General: Skin is warm and dry.  Neurological:     Mental Status: She is alert.     Comments: No facial droop or slurred speech. 5/5 strength in all 4 extremities. Grossly normal sensation. Normal finger to nose.      (all labs ordered are listed, but only abnormal results are displayed) Labs Reviewed - No data to display  EKG: None  Radiology: CT Head Wo Contrast Result Date:  11/06/2023 EXAM: CT HEAD WITHOUT CONTRAST 11/06/2023 08:05:31 PM TECHNIQUE: CT of the head was performed without the administration of intravenous contrast. Automated exposure control, iterative reconstruction, and/or weight based adjustment of the mA/kV was utilized to reduce the radiation dose to as low as reasonably achievable. COMPARISON: 05/23/2022 CLINICAL HISTORY: Head trauma, minor (Age >= 65y). fall. Pt was in the laundry room felt dizzy and passed out. Pt has a slight laceration above right eye (bleeding controlled). Pt also has a right elbow pain. Pt ia A\T\Ox4. In triage. Pt is not on blood thinners. FINDINGS: BRAIN AND VENTRICLES: No acute hemorrhage. Gray-white differentiation is preserved. No hydrocephalus. No extra-axial collection. No mass effect or midline shift. Mild subcortical and periventricular small vessel ischemic changes. ORBITS: Mild soft tissue swelling/laceration along the right lateral orbit (image 9). SINUSES: Partial opacification of the right maxillary sinus. SOFT TISSUES AND SKULL: No acute soft tissue abnormality. No skull fracture. Intracranial atherosclerosis. IMPRESSION: 1. No acute intracranial abnormality. 2. Mild soft tissue swelling/laceration along the right lateral orbit. 3. Mild small vessel ischemic changes. Electronically signed by: Pinkie Pebbles MD 11/06/2023 08:16 PM EDT RP Workstation: [id number]   DG Elbow Complete Right Result Date: 11/06/2023 CLINICAL DATA:  Pain after fall. EXAM: RIGHT ELBOW - COMPLETE 3+ VIEW COMPARISON:  None Available. FINDINGS: There is an elbow joint effusion typically seen intra-articular fracture, although no definite fracture line is seen. The alignment and joint spaces are preserved. There is mild soft tissue edema posteriorly. IMPRESSION: Elbow joint effusion typically seen intra-articular fracture, although no definite fracture is seen. Recommend orthopedic follow-up and radiographs in 7-10 days. Electronically Signed   By:  Andrea Gasman M.D.   On: 11/06/2023 20:14     Procedures   Medications Ordered in the ED - No data to display                                  Medical Decision Making Amount and/or Complexity of Data Reviewed Radiology: ordered and independent interpretation performed.    Details: No head bleed.  Elbow joint effusion   Triage note indicates syncope but the patient specifically states this was more that she tripped over her own feet and never lost consciousness.  CT head is unremarkable.  Tdap was updated last year.  The wound is minor near her eye without need for laceration repair.  She does have a joint effusion concerning for possible occult fracture, especially with limitation in the range of motion of her elbow.  I discussed this with her and discussed we can immobilize with a splint but she prefers not to  do that.  Discussed that if this truly is an occult fracture that would be the best treatment but she would prefer to just do a sling for now and follow-up with her orthopedist.  Otherwise she is neurovascular intact.  Will discharge home with return precautions.     Final diagnoses:  Fall, initial encounter  Abrasion of periorbital region of face, initial encounter  Elbow injury, right, initial encounter    ED Discharge Orders     None          Freddi Hamilton, MD 11/06/23 2332

## 2023-11-09 DIAGNOSIS — S0011XA Contusion of right eyelid and periocular area, initial encounter: Secondary | ICD-10-CM | POA: Diagnosis not present

## 2023-11-12 ENCOUNTER — Ambulatory Visit: Payer: Medicare Other | Admitting: Gastroenterology

## 2023-11-18 DIAGNOSIS — R188 Other ascites: Secondary | ICD-10-CM | POA: Diagnosis not present

## 2023-11-18 DIAGNOSIS — D649 Anemia, unspecified: Secondary | ICD-10-CM | POA: Diagnosis not present

## 2023-11-18 DIAGNOSIS — K7460 Unspecified cirrhosis of liver: Secondary | ICD-10-CM | POA: Diagnosis not present

## 2023-11-19 DIAGNOSIS — H26491 Other secondary cataract, right eye: Secondary | ICD-10-CM | POA: Diagnosis not present

## 2023-11-19 LAB — COMPREHENSIVE METABOLIC PANEL WITH GFR
ALT: 13 IU/L (ref 0–32)
AST: 25 IU/L (ref 0–40)
Albumin: 3.7 g/dL — ABNORMAL LOW (ref 3.8–4.8)
Alkaline Phosphatase: 83 IU/L (ref 44–121)
BUN/Creatinine Ratio: 8 — ABNORMAL LOW (ref 12–28)
BUN: 7 mg/dL — ABNORMAL LOW (ref 8–27)
Bilirubin Total: 0.8 mg/dL (ref 0.0–1.2)
CO2: 22 mmol/L (ref 20–29)
Calcium: 8.5 mg/dL — ABNORMAL LOW (ref 8.7–10.3)
Chloride: 107 mmol/L — ABNORMAL HIGH (ref 96–106)
Creatinine, Ser: 0.91 mg/dL (ref 0.57–1.00)
Globulin, Total: 2.1 g/dL (ref 1.5–4.5)
Glucose: 158 mg/dL — ABNORMAL HIGH (ref 70–99)
Potassium: 4.1 mmol/L (ref 3.5–5.2)
Sodium: 142 mmol/L (ref 134–144)
Total Protein: 5.8 g/dL — ABNORMAL LOW (ref 6.0–8.5)
eGFR: 64 mL/min/1.73 (ref >59)

## 2023-11-19 LAB — CBC WITH DIFFERENTIAL/PLATELET
Basophils Absolute: 0 x10E3/uL (ref 0.0–0.2)
Basos: 1 %
EOS (ABSOLUTE): 0.1 x10E3/uL (ref 0.0–0.4)
Eos: 4 %
Hematocrit: 31.1 % — ABNORMAL LOW (ref 34.0–46.6)
Hemoglobin: 10.3 g/dL — ABNORMAL LOW (ref 11.1–15.9)
Immature Grans (Abs): 0 x10E3/uL (ref 0.0–0.1)
Immature Granulocytes: 0 %
Lymphocytes Absolute: 1 x10E3/uL (ref 0.7–3.1)
Lymphs: 33 %
MCH: 33 pg (ref 26.6–33.0)
MCHC: 33.1 g/dL (ref 31.5–35.7)
MCV: 100 fL — ABNORMAL HIGH (ref 79–97)
Monocytes Absolute: 0.2 x10E3/uL (ref 0.1–0.9)
Monocytes: 8 %
Neutrophils Absolute: 1.6 x10E3/uL (ref 1.4–7.0)
Neutrophils: 54 %
Platelets: 70 x10E3/uL — CL (ref 150–450)
RBC: 3.12 x10E6/uL — ABNORMAL LOW (ref 3.77–5.28)
RDW: 13.1 % (ref 11.7–15.4)
WBC: 3 x10E3/uL — ABNORMAL LOW (ref 3.4–10.8)

## 2023-11-19 LAB — PROTIME-INR
INR: 1.1 (ref 0.9–1.2)
Prothrombin Time: 12.2 s — ABNORMAL HIGH (ref 9.1–12.0)

## 2023-11-19 LAB — AFP TUMOR MARKER: AFP, Serum, Tumor Marker: 5.3 ng/mL (ref 0.0–9.2)

## 2023-11-20 ENCOUNTER — Ambulatory Visit: Payer: Self-pay | Admitting: Internal Medicine

## 2023-11-22 NOTE — Progress Notes (Unsigned)
 GI Office Note    Referring Provider: Sheryle Carwin, MD Primary Care Physician:  Sheryle Carwin, MD Primary Gastroenterologist: Lamar HERO.Rourk, MD  Date:  11/23/2023  ID:  BRYLEIGH OTTAWAY, DOB 03/25/1946, MRN [id number]  Chief Complaint   Chief Complaint  Patient presents with   Follow-up    Doing well, no issues   History of Present Illness  Jolynda D Hedberg is a 78 y.o. female with a history of hiatal hernia, GERD, anxiety, macular degeneration, and cirrhosis presenting today for 69-month cirrhosis follow-up.   EGD December 2015: -noncritical Schatzki's ring -small hiatal hernia -abnormal gastric mucosa with gastric polyps (benign biopsy)    Colonoscopy December 2015: -diverticulosis -tubular adenomas removed -random colon biopsies negative  -Repeat in 7 years   Patient offered colonoscopy during virtual visit in June however patient declined. Pantoprazole  BID refilled for GERD. Cologuard offered and declined.    PCP recently performed CT A/P 11/29 noting hepatic steatosis and capsular nodularity consistent with cirrhosis. Prior cholecystectomy. Normal pancreas. Splenomegaly present (portal hypertension). Diffuse gastric and small bowel wall thickening and mesenteric edema consistent with portal gastropathy. Sigmoid diverticulosis. Mild to moderate ascites and diffuse mesenteric and body wall edema.    Had ED visit 03/29/22. GI consulted with in ED and Dr. Shaaron recommended low sodium diet, low dose lasix  and to begin lactulose . Labs: Na 132, albumin 2.8, cr 0.71, T Bili 1.7, AST 50, AT 18, Alk phos 137, Hgb 11.3, MCV 117, plts 97, ammonia 25, INR 1.3, BNP 236   OV 04/17/22. Lower abdominal edema and ongoing peripheral edema. BM 2-3 per day. No fevers/chills. Lasix  20mg  daily. 2 glasses wine daily. Appetite improved since ED visit. Watching salt intake. Pain with walking and standing. Taking advil PM. Trouble with insomnia. Not weighing at home. Labs ordered. Continue PPI. Continue  lasix  20 mg daily, briefly increase to BID. Start spironolactone  25 mg daily. BMP in 1 week.    Labs 05/02/22: Not immune to hep A or Hep B. No active viral hepatitis. AFP elevated at 9.1. ANA, ASMA, AMA negative. BMP wnl. INR 1.2   OV 05/15/22.  Lower extreme edema improved but still having some fluid in her thighs and knees.  Recent cellulitis.  1-2 bowel movements daily.  Occasional confusion but nothing more above baseline.  And mobility improved.  Spironolactone  increased from 25 mg to 50 mg.  Lasix  continued at 20 mg daily.   At the end of April 2024 patient's husband reported patient has been little bit more sleepy, not taking lactulose  on a regular basis.  The diuretics were causing her sleepiness.  Offered labs,  lactulose  was sent in to take and titrate for bowel movements.  Advised that diuretics in the afternoon  could cause issues with sleep given need for nocturnal urination.   OV June 2024 -has stopped taking Lasix  and spironolactone  given she was not having a lot of swelling and reportedly not feeling sleepy or at he like a zombie as previous.  Was taking lactulose  1 tablespoon daily and having usually 2 semiformed bowel movements daily.  Eating fresh veggies.  No reflux symptoms.  Drinking water  Reduced throughout the day.  Following low-sodium diet.  Taking melatonin nightly was scheduled for upper endoscopy for variceal surveillance and plans for labs and imaging in November 2024.  Also advised to continue PPI twice daily.  EGD August 2024: - Normal esophagus.  - Moderate portal hypertensive gastropathy.  - Normal duodenal bulb and second portion of the duodenum.  -  No specimens collected. - Advise repeat upper endoscopy in 1.5 years.  Last office visit 05/15/23 with Dr. Shaaron.  Reportedly taking various over-the-counter sleep aids as well as clonazepam at night and reportedly feeling foggy in the mornings.  H out of surveillance colonoscopy.  Weight up 4 pounds but also wearing  winter clothing.  Having 2-3 bowel movements daily with some nocturnal reflux symptoms but also has tendency of eating and drinking right before bedtime.  She queried regarding wine at bedtime asking if it was permissible. Advised avoidance of otc sleep aide. Avoid alcohol entirely. Prescription for carafate given and advised to consider regular exercise and continue lactulose . Continue pantoprazole  40 mg BID.            Latest Ref Rng & Units 11/18/2023   11:26 AM 02/23/2023    1:40 PM 12/02/2022   10:21 AM  CMP  Glucose 70 - 99 mg/dL 841  879  834   BUN 8 - 27 mg/dL 7  7  7    Creatinine 0.57 - 1.00 mg/dL 9.08  9.17  9.13   Sodium 134 - 144 mmol/L 142  142  138   Potassium 3.5 - 5.2 mmol/L 4.1  4.2  3.9   Chloride 96 - 106 mmol/L 107  108  106   CO2 20 - 29 mmol/L 22  27  23    Calcium 8.7 - 10.3 mg/dL 8.5  9.2  8.9   Total Protein 6.0 - 8.5 g/dL 5.8  6.2  6.2   Total Bilirubin 0.0 - 1.2 mg/dL 0.8  1.0  1.1   Alkaline Phos 44 - 121 IU/L 83   67   AST 0 - 40 IU/L 25  22  23    ALT 0 - 32 IU/L 13  10  12        Latest Ref Rng & Units 11/18/2023   11:27 AM 12/02/2022   10:21 AM 08/29/2022    3:39 PM  CBC  WBC 3.4 - 10.8 x10E3/uL 3.0  3.6  3.8   Hemoglobin 11.1 - 15.9 g/dL 89.6  89.3  89.9   Hematocrit 34.0 - 46.6 % 31.1  32.6  29.0   Platelets 150 - 450 x10E3/uL 70  84  75    Today:  Cirrhosis history Hematemesis/coffee ground emesis: none History of variceal bleeding: none Abdominal pain: none Abdominal distention/worsening ascites: none Fever/chills: none Episodes of confusion/disorientation: none Number of daily bowel movements: 2 or more Taking diuretics?: none  Date of last EGD: August 2024 without varices - due early 2026 Prior history of banding?: none Prior episodes of SBP: no Last time liver imaging was performed: Nov 2024 - no hepatoma, patent portal flow - simple right kidney cyst.  Last AFP: 5.3 (normal) 11/18/23  Received hepatitis A and B vaccination in  2024.  MELD 3.0: 8 at 11/18/2023 11:27 AM MELD-Na: 7 at 11/18/2023 11:27 AM Calculated from: Serum Creatinine: 0.91 mg/dL (Using min of 1 mg/dL) at 2/69/7974 88:73 AM Serum Sodium: 142 mmol/L (Using max of 137 mmol/L) at 11/18/2023 11:26 AM Total Bilirubin: 0.8 mg/dL (Using min of 1 mg/dL) at 2/69/7974 88:73 AM Serum Albumin: 3.7 g/dL (Using max of 3.5 g/dL) at 2/69/7974 88:73 AM INR(ratio): 1.1 at 11/18/2023 11:27 AM Age at listing (hypothetical): 72 years Sex: Female at 11/18/2023 11:27 AM  Discussed the use of AI scribe software for clinical note transcription with the patient, who gave verbal consent to proceed.  She has no issues with fluid retention in her abdomen  or legs and has been off diuretics without experiencing swelling. Her appetite is described as 'nonstop', with cravings for fried green tomatoes. She maintains regular bowel movements, going at least once daily, and reports no recent fevers, chills, nausea, vomiting, or changes in bowel habits such as blood or black stools. No vision changes, itching, or jaundice. She experiences occasional dizziness when sitting up quickly. She reports that she is not currently taking iron supplements.  She takes pantoprazole  twice daily for acid reflux, which effectively controls her symptoms unless she misses a dose. She also takes lactulose  in the afternoon, approximately a tablespoon. For sleep, she uses melatonin 10 mg at night, which she finds helpful without causing morning grogginess.  Three weeks ago, she experienced a fall in the laundry room due to a loud thunderclap, resulting in soreness but no significant injuries as confirmed by a CT scan. She had a bruised elbow and a bruise from a blood draw, which was initially painful but is no longer sore.  Her diet includes fresh or frozen vegetables, avoiding canned ones due to sodium content. She enjoys oranges, bananas, and snickerdoodles. Does not eat much red meat at all. Adhering to 2g  sodium diet.       Wt Readings from Last 5 Encounters:  11/23/23 154 lb 6.4 oz (70 kg)  11/06/23 153 lb (69.4 kg)  05/15/23 153 lb (69.4 kg)  12/03/22 149 lb 7.6 oz (67.8 kg)  12/02/22 149 lb 7.6 oz (67.8 kg)    Current Outpatient Medications  Medication Sig Dispense Refill   acetaminophen (TYLENOL) 325 MG tablet Take 650 mg by mouth every 6 (six) hours as needed.     clonazePAM (KLONOPIN) 0.5 MG tablet Take 0.25 mg by mouth daily.     diclofenac Sodium (VOLTAREN) 1 % GEL Apply 2 g topically daily as needed (arthritis pain).     fluticasone (FLONASE) 50 MCG/ACT nasal spray Place 2 sprays into both nostrils daily as needed for allergies.     lactulose  (CHRONULAC ) 10 GM/15ML solution Take 15 mLs (10 g total) by mouth daily. 946 mL 3   pantoprazole  (PROTONIX ) 40 MG tablet TAKE 1 TABLET(40 MG) BY MOUTH TWICE DAILY 180 tablet 1   No current facility-administered medications for this visit.    Past Medical History:  Diagnosis Date   Anxiety    Cirrhosis (HCC)    GERD (gastroesophageal reflux disease)    History of hiatal hernia    HOH (hard of hearing)    Macular degeneration    PONV (postoperative nausea and vomiting)     Past Surgical History:  Procedure Laterality Date   CATARACT EXTRACTION EXTRACAPSULAR Right 05/24/2021   Procedure: CATARACT EXTRACTION PHACO AND INTRAOCULAR LENS PLACEMENT (IOC);  Surgeon: Harrie Agent, MD;  Location: [facility];  Service: Ophthalmology;  Laterality: Right;  CDE 12.48   CATARACT EXTRACTION W/PHACO Left 06/07/2021   Procedure: CATARACT EXTRACTION PHACO AND INTRAOCULAR LENS PLACEMENT (IOC);  Surgeon: Harrie Agent, MD;  Location: [facility];  Service: Ophthalmology;  Laterality: Left;  CDE: 5.36   CHOLECYSTECTOMY  2011   Dr. Delsa   COLONOSCOPY N/A 04/05/2014   MFM:rnonwpr diverticulosis,colonic polyps removed (TA), random colon biopsies negative. next TCS 03/2021   cyst removed from right hand     ESOPHAGOGASTRODUODENOSCOPY N/A 04/05/2014    MFM:wnwrmpuprjo schazkis ring/HH, abnormal gastric mucosa with gastric polyps (benign)   ESOPHAGOGASTRODUODENOSCOPY (EGD) WITH PROPOFOL  N/A 12/03/2022   Procedure: ESOPHAGOGASTRODUODENOSCOPY (EGD) WITH PROPOFOL ;  Surgeon: Shaaron Lamar HERO, MD;  Location: [facility]  ENDO SUITE;  Service: Endoscopy;  Laterality: N/A;  1115am, asa 3, pt does not want to move up    Family History  Problem Relation Age of Onset   Pancreatic cancer Mother    Diabetes Other    Heart disease Other    Colon cancer Neg Hx     Allergies as of 11/23/2023 - Review Complete 11/23/2023  Allergen Reaction Noted   Codeine Nausea And Vomiting 03/14/2014   Mucinex [guaifenesin er] Nausea And Vomiting 04/04/2023    Social History   Socioeconomic History   Marital status: Married    Spouse name: Not on file   Number of children: 1   Years of education: Not on file   Highest education level: Not on file  Occupational History   Not on file  Tobacco Use   Smoking status: Former    Current packs/day: 0.00    Average packs/day: 1.5 packs/day for 20.0 years (30.0 ttl pk-yrs)    Types: Cigarettes    Start date: 24    Quit date: 58    Years since quitting: 41.6    Passive exposure: Past   Smokeless tobacco: Never  Substance and Sexual Activity   Alcohol use: Not Currently    Alcohol/week: 14.0 standard drinks of alcohol    Types: 14 Glasses of wine per week   Drug use: No   Sexual activity: Not on file  Other Topics Concern   Not on file  Social History Narrative   Not on file   Social Drivers of Health   Financial Resource Strain: Not on file  Food Insecurity: Not on file  Transportation Needs: Not on file  Physical Activity: Not on file  Stress: Not on file  Social Connections: Not on file     Review of Systems   Gen: Denies fever, chills, anorexia. Denies fatigue, weakness, weight loss.  CV: Denies chest pain, palpitations, syncope, peripheral edema, and claudication. Resp: Denies dyspnea at rest,  cough, wheezing, coughing up blood, and pleurisy. GI: See HPI Derm: Denies rash, itching, dry skin Psych: Denies depression, anxiety, memory loss, confusion. No homicidal or suicidal ideation.  Heme: Denies bruising, bleeding, and enlarged lymph nodes.  Physical Exam   BP (!) 144/69 (BP Location: Right Arm, Patient Position: Sitting, Cuff Size: Normal)   Pulse 77   Temp 98.3 F (36.8 C) (Oral)   Ht 5' 6 (1.676 m)   Wt 154 lb 6.4 oz (70 kg)   SpO2 95%   BMI 24.92 kg/m   General:   Alert and oriented. No distress noted. Pleasant and cooperative.  Head:  Normocephalic and atraumatic. Eyes:  Conjuctiva clear without scleral icterus. Mouth:  Oral mucosa pink and moist. Good dentition. No lesions. Lungs:  Clear to auscultation bilaterally. No wheezes, rales, or rhonchi. No distress.  Heart:  S1, S2 present without murmurs appreciated.  Abdomen:  +BS, soft, non-tender and non-distended. No rebound or guarding. No HSM or masses noted. Rectal: deferred Msk:  Symmetrical without gross deformities. Normal posture. Extremities:  Without edema. Neurologic:  Alert and  oriented x4 Psych:  Alert and cooperative. Normal mood and affect.  Assessment & Plan  Jenesa D Mcweeney is a 78 y.o. female presenting today for cirrhosis follow up.      Cirrhosis of liver with hepatic encephalopathy No recent episodes of confusion or hypersomnia. Liver enzymes are stable, hemoglobin is 10 g/dL, and no jaundice or ascites. Currently maintained off diuretics following low sodium diet. No recent gastrointestinal or variceal  bleeding. No asterixis, ascites, jaundice, pruritus. Does have fatigue. EGD in August 2024 without varices but with portal gastropathy. MELD score stable. AFP normal.  - RUQ US  now and in 6 months for HCC screening.  - Increase lactulose  to twice daily if increased sleepiness or confusion occurs. 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 Platelet count is low but stable. No active bleeding or significant bruising beyond minor trauma-related bruising. - Monitor platelet levels during routine lab work. - Check iron levels during next lab work in six months. - Discussed dietary sources of iron.  Gastroesophageal reflux disease (GERD) GERD is well-controlled with pantoprazole  twice daily. Occasional symptoms occur if a dose is missed. - Continue pantoprazole  40 mg twice daily.  Insomnia Insomnia is well-managed with melatonin 10 mg at night. No morning grogginess or drowsiness reported. - Continue melatonin 10 mg at night. - Monitor for excessive drowsiness and HE     Follow up   Follow up 6 months  Charmaine Melia, MSN, FNP-BC, AGACNP-BC [facility]

## 2023-11-23 ENCOUNTER — Encounter: Payer: Self-pay | Admitting: Gastroenterology

## 2023-11-23 ENCOUNTER — Encounter: Payer: Self-pay | Admitting: *Deleted

## 2023-11-23 ENCOUNTER — Ambulatory Visit: Admitting: Gastroenterology

## 2023-11-23 VITALS — BP 144/69 | HR 77 | Temp 98.3°F | Ht 66.0 in | Wt 154.4 lb

## 2023-11-23 DIAGNOSIS — K219 Gastro-esophageal reflux disease without esophagitis: Secondary | ICD-10-CM

## 2023-11-23 DIAGNOSIS — D696 Thrombocytopenia, unspecified: Secondary | ICD-10-CM | POA: Diagnosis not present

## 2023-11-23 DIAGNOSIS — K7682 Hepatic encephalopathy: Secondary | ICD-10-CM

## 2023-11-23 DIAGNOSIS — D509 Iron deficiency anemia, unspecified: Secondary | ICD-10-CM | POA: Diagnosis not present

## 2023-11-23 DIAGNOSIS — K7460 Unspecified cirrhosis of liver: Secondary | ICD-10-CM | POA: Diagnosis not present

## 2023-11-23 DIAGNOSIS — R188 Other ascites: Secondary | ICD-10-CM

## 2023-11-23 DIAGNOSIS — D649 Anemia, unspecified: Secondary | ICD-10-CM

## 2023-11-23 DIAGNOSIS — G4700 Insomnia, unspecified: Secondary | ICD-10-CM | POA: Diagnosis not present

## 2023-11-23 NOTE — Patient Instructions (Signed)
 VISIT SUMMARY: Today, we reviewed your overall health, focusing on your liver condition, blood platelet levels, acid reflux, sleep patterns, and recent fall. Your liver enzymes are stable, and there are no signs of serious complications. We discussed your current medications and dietary habits, and you reported no significant new symptoms.  YOUR PLAN:  -CIRRHOSIS OF LIVER WITH HEPATIC ENCEPHALOPATHY and Iron deficiency anemia: Cirrhosis is a condition where the liver is scarred and its function is impaired. Hepatic encephalopathy is a decline in brain function due to severe liver disease. Your liver enzymes are stable, and there are no signs of confusion or excessive sleepiness. We will continue to monitor your liver with an ultrasound every six months and check your iron levels during your next lab work in 6 months. If you experience increased sleepiness or confusion, increase your lactulose  to twice daily or contact the office for further recommendations. Please make an office visit if significant lower extremity edema or fluid retention in the abdomen occurs.   -THROMBOCYTOPENIA: Thrombocytopenia is a condition where you have a lower than normal number of platelets, which help your blood clot. This is secondary to your cirrhosis. Your platelet count is low but stable, and there is no active bleeding. We will continue to monitor your platelet levels during routine lab work.  -GASTROESOPHAGEAL REFLUX DISEASE (GERD): GERD is a condition where stomach acid frequently flows back into the tube connecting your mouth and stomach. Your symptoms are well-controlled with pantoprazole  taken twice daily. Continue taking pantoprazole  as prescribed.  -INSOMNIA: Insomnia is difficulty falling or staying asleep. Your insomnia is well-managed with melatonin 10 mg at night, and you do not experience morning grogginess. Continue taking melatonin as prescribed.  INSTRUCTIONS:  Please schedule your liver ultrasound  every six months and ensure your iron levels are checked during your next lab work in six months. If you notice increased sleepiness or confusion, increase your lactulose  to twice daily. Continue taking your medications as prescribed and follow the dietary recommendations discussed.  Follow up in 6 months.   It was a pleasure to see you today. I want to create trusting relationships with patients. If you receive a survey regarding your visit,  I greatly appreciate you taking time to fill this out on paper or through your MyChart. I value your feedback.  Charmaine Melia, MSN, FNP-BC, AGACNP-BC [facility]

## 2023-11-25 ENCOUNTER — Other Ambulatory Visit: Payer: Self-pay | Admitting: *Deleted

## 2023-11-25 DIAGNOSIS — D649 Anemia, unspecified: Secondary | ICD-10-CM

## 2023-11-25 DIAGNOSIS — R188 Other ascites: Secondary | ICD-10-CM

## 2023-11-30 ENCOUNTER — Ambulatory Visit (HOSPITAL_COMMUNITY)
Admission: RE | Admit: 2023-11-30 | Discharge: 2023-11-30 | Disposition: A | Discharge status: Home / Self Care | Source: Ambulatory Visit | Attending: Gastroenterology | Admitting: Gastroenterology

## 2023-11-30 DIAGNOSIS — K7460 Unspecified cirrhosis of liver: Secondary | ICD-10-CM | POA: Diagnosis not present

## 2023-11-30 DIAGNOSIS — K7689 Other specified diseases of liver: Secondary | ICD-10-CM | POA: Diagnosis not present

## 2023-11-30 DIAGNOSIS — R188 Other ascites: Secondary | ICD-10-CM | POA: Insufficient documentation

## 2023-11-30 DIAGNOSIS — Z9049 Acquired absence of other specified parts of digestive tract: Secondary | ICD-10-CM | POA: Diagnosis not present

## 2023-12-07 ENCOUNTER — Ambulatory Visit: Payer: Self-pay | Admitting: Gastroenterology

## 2023-12-14 DIAGNOSIS — D225 Melanocytic nevi of trunk: Secondary | ICD-10-CM | POA: Diagnosis not present

## 2023-12-14 DIAGNOSIS — L820 Inflamed seborrheic keratosis: Secondary | ICD-10-CM | POA: Diagnosis not present

## 2023-12-14 DIAGNOSIS — L821 Other seborrheic keratosis: Secondary | ICD-10-CM | POA: Diagnosis not present

## 2023-12-30 ENCOUNTER — Telehealth (INDEPENDENT_AMBULATORY_CARE_PROVIDER_SITE_OTHER): Payer: Self-pay

## 2023-12-30 NOTE — Telephone Encounter (Signed)
 Patient's husband called and left a message stating that her hearing aids are not working, they are showing fully charged.  He is wanting to get them looked at.  Please call [phone]

## 2024-01-05 ENCOUNTER — Ambulatory Visit (INDEPENDENT_AMBULATORY_CARE_PROVIDER_SITE_OTHER): Payer: Self-pay | Admitting: Audiology

## 2024-01-05 DIAGNOSIS — H903 Sensorineural hearing loss, bilateral: Secondary | ICD-10-CM

## 2024-01-05 NOTE — Progress Notes (Signed)
  [address], [address] [place], [place] [address] [phone]  Hearing Aid Check     Nicole Barrett comes to drop off her aids to be checked because she cannot hear from them.     Right Left  Hearing aid manufacturer [employer] P50-R SN: [id number] Renaldo Oda BARGES SN: [id number]  Hearing aid style Receiver in the canal Receiver in the canal  Hearing aid battery rechargeable rechargeable  Receiver 37M 37M  Dome/ custom earpiece Small vented domes Small vented dome  Retention wire no no  Warranty expiration date 06-20-2022 06-20-2022  Loss and Damage expired expired  Additional accessories Expiration date    Initial fitting date 04-04-2020 04-04-2020  Device was fit [facility]: [facility]       Actions taken: Both filters were clogged with wax. Cleaned the aids. After charging them they sound WNL.  Services fee: $30 will be due at pick up. No charge today.   Patient was called  and her husband answered the call. He said he would pick up the aids tomorrow Wednesday and pay $30 at pick up.     Recommend: Return for a hearing aid check , as needed. Return for a hearing evaluation and to see an ENT, if concerns with hearing changes arise.    [name], AUD

## 2024-01-06 ENCOUNTER — Ambulatory Visit (INDEPENDENT_AMBULATORY_CARE_PROVIDER_SITE_OTHER): Payer: Self-pay | Admitting: Audiology

## 2024-01-06 DIAGNOSIS — H903 Sensorineural hearing loss, bilateral: Secondary | ICD-10-CM

## 2024-01-08 NOTE — Progress Notes (Signed)
  [address] [place], [place] [address] [phone]  Hearing Aid Check     Nicole Barrett walked in to pick up her aids.      Right Left  Hearing aid manufacturer [place] P50-R SN: [id number] Renaldo Oda BARGES SN: [id number]  Hearing aid style Receiver in the canal Receiver in the canal  Hearing aid battery rechargeable rechargeable  Receiver 75M 75M  Dome/ custom earpiece Small vented domes Small vented dome  Retention wire no no  Warranty expiration date 06-20-2022 06-20-2022  Loss and Damage expired expired  Additional accessories Expiration date      Initial fitting date 04-04-2020 04-04-2020  Device was fit at: [facility]       Actions taken: Hearing aids were cleaned after they were dropped off.   Services fee: $30 was paid at pick-up     Recommend: Return for a hearing aid check , as needed. Return for a hearing evaluation and to see an ENT, if concerns with hearing changes arise.    Nicole Barrett Nicole Barrett, AUD

## 2024-03-23 ENCOUNTER — Other Ambulatory Visit (HOSPITAL_COMMUNITY): Payer: Self-pay | Admitting: Internal Medicine

## 2024-03-23 DIAGNOSIS — N63 Unspecified lump in unspecified breast: Secondary | ICD-10-CM

## 2024-04-07 ENCOUNTER — Inpatient Hospital Stay (HOSPITAL_COMMUNITY): Admission: RE | Admit: 2024-04-07 | Discharge: 2024-04-07 | Attending: Internal Medicine | Admitting: Internal Medicine

## 2024-04-07 ENCOUNTER — Ambulatory Visit (HOSPITAL_COMMUNITY): Admission: RE | Admit: 2024-04-07

## 2024-04-07 ENCOUNTER — Ambulatory Visit (HOSPITAL_COMMUNITY)

## 2024-04-07 ENCOUNTER — Other Ambulatory Visit (HOSPITAL_COMMUNITY): Payer: Self-pay | Admitting: Internal Medicine

## 2024-04-07 ENCOUNTER — Encounter (HOSPITAL_COMMUNITY): Payer: Self-pay

## 2024-04-07 DIAGNOSIS — R928 Other abnormal and inconclusive findings on diagnostic imaging of breast: Secondary | ICD-10-CM

## 2024-04-07 DIAGNOSIS — N63 Unspecified lump in unspecified breast: Secondary | ICD-10-CM | POA: Diagnosis present

## 2024-04-07 DIAGNOSIS — N6321 Unspecified lump in the left breast, upper outer quadrant: Secondary | ICD-10-CM | POA: Diagnosis not present

## 2024-04-12 ENCOUNTER — Encounter: Payer: Self-pay | Admitting: Gastroenterology

## 2024-04-18 ENCOUNTER — Other Ambulatory Visit (HOSPITAL_COMMUNITY): Payer: Self-pay | Admitting: Internal Medicine

## 2024-04-18 DIAGNOSIS — R928 Other abnormal and inconclusive findings on diagnostic imaging of breast: Secondary | ICD-10-CM

## 2024-04-19 ENCOUNTER — Encounter (HOSPITAL_COMMUNITY): Payer: Self-pay

## 2024-04-19 ENCOUNTER — Ambulatory Visit (HOSPITAL_COMMUNITY)
Admission: RE | Admit: 2024-04-19 | Discharge: 2024-04-19 | Disposition: A | Discharge status: Home / Self Care | Source: Ambulatory Visit | Attending: Internal Medicine | Admitting: Internal Medicine

## 2024-04-19 ENCOUNTER — Inpatient Hospital Stay (HOSPITAL_COMMUNITY): Admission: RE | Admit: 2024-04-19 | Discharge: 2024-04-19 | Attending: Internal Medicine | Admitting: Internal Medicine

## 2024-04-19 ENCOUNTER — Other Ambulatory Visit: Payer: Self-pay | Admitting: Internal Medicine

## 2024-04-19 ENCOUNTER — Telehealth: Payer: Self-pay | Admitting: *Deleted

## 2024-04-19 DIAGNOSIS — R928 Other abnormal and inconclusive findings on diagnostic imaging of breast: Secondary | ICD-10-CM

## 2024-04-19 HISTORY — PX: BREAST BIOPSY: SHX20

## 2024-04-19 MED ORDER — LIDOCAINE-EPINEPHRINE (PF) 1 %-1:200000 IJ SOLN
10.0000 mL | Freq: Once | INTRAMUSCULAR | Status: AC
  Administered 2024-04-19: 10 mL via INTRADERMAL

## 2024-04-19 MED ORDER — LIDOCAINE HCL (PF) 2 % IJ SOLN
10.0000 mL | Freq: Once | INTRAMUSCULAR | Status: AC
  Administered 2024-04-19: 10 mL

## 2024-04-19 MED ORDER — LIDOCAINE HCL (PF) 2 % IJ SOLN
INTRAMUSCULAR | Status: AC
  Filled 2024-04-19: qty 10

## 2024-04-19 NOTE — Progress Notes (Signed)
 PT tolerated Left breast biopsy well today with NAD noted. PT verbalized understanding of discharge instructions and given ice packs to use. PT ambulated back to the mammogram area this time. Specimens taken to lab by Bernarda from ultrasound for processing.

## 2024-04-19 NOTE — Telephone Encounter (Signed)
 Pt needs more refills on lactulose . Pt last OV 11/23/2023

## 2024-04-20 ENCOUNTER — Other Ambulatory Visit: Payer: Self-pay | Admitting: Internal Medicine

## 2024-04-20 DIAGNOSIS — K219 Gastro-esophageal reflux disease without esophagitis: Secondary | ICD-10-CM

## 2024-04-20 NOTE — Telephone Encounter (Signed)
 Informed pt that prescriptions was sent

## 2024-04-22 LAB — SURGICAL PATHOLOGY

## 2024-05-11 ENCOUNTER — Other Ambulatory Visit: Payer: Self-pay | Admitting: *Deleted

## 2024-05-11 DIAGNOSIS — C50912 Malignant neoplasm of unspecified site of left female breast: Secondary | ICD-10-CM

## 2024-05-17 ENCOUNTER — Ambulatory Visit: Admitting: General Surgery

## 2024-05-17 ENCOUNTER — Encounter: Payer: Self-pay | Admitting: General Surgery

## 2024-05-17 VITALS — BP 134/72 | HR 68 | Temp 98.0°F | Resp 18 | Ht 66.0 in | Wt 159.0 lb

## 2024-05-17 DIAGNOSIS — Z171 Estrogen receptor negative status [ER-]: Secondary | ICD-10-CM | POA: Diagnosis not present

## 2024-05-17 DIAGNOSIS — C50412 Malignant neoplasm of upper-outer quadrant of left female breast: Secondary | ICD-10-CM

## 2024-05-17 NOTE — Progress Notes (Signed)
 Nicole Barrett; [id number]; [birth year]   HPI Patient is a 79 year old white female who is referred to my care by Dr. Gaither Langton for evaluation and treatment of a left breast cancer.  This was recently found on mammography.  The mass is almost 3 cm in its greatest diameter.  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  Patient denies any family history of colon cancer.  She does have a history of significant cirrhosis which has been well-controlled.  She has no esophageal varices or ascites.  She does have thrombocytopenia. Past Medical History:  Diagnosis Date   Anxiety    Cirrhosis (HCC)    GERD (gastroesophageal reflux disease)    History of hiatal hernia    HOH (hard of hearing)    Macular degeneration    PONV (postoperative nausea and vomiting)     Past Surgical History:  Procedure Laterality Date   BREAST BIOPSY Left 04/19/2024   US  LT BREAST BX W LOC DEV 1ST LESION IMG BX SPEC US  GUIDE 04/19/2024 [facility]   CATARACT EXTRACTION EXTRACAPSULAR Right 05/24/2021   Procedure: CATARACT EXTRACTION PHACO AND INTRAOCULAR LENS PLACEMENT (IOC);  Surgeon: Harrie Agent, MD;  Location: [facility];  Service: Ophthalmology;  Laterality: Right;  CDE 12.48   CATARACT EXTRACTION W/PHACO Left 06/07/2021   Procedure: CATARACT EXTRACTION PHACO AND INTRAOCULAR LENS PLACEMENT (IOC);  Surgeon: Harrie Agent, MD;  Location: [facility];  Service: Ophthalmology;  Laterality: Left;  CDE: 5.36   CHOLECYSTECTOMY  2011   Dr. Delsa   COLONOSCOPY N/A 04/05/2014   MFM:rnonwpr diverticulosis,colonic polyps removed (TA), random colon biopsies negative. next TCS 03/2021   cyst removed from right hand     ESOPHAGOGASTRODUODENOSCOPY N/A 04/05/2014   MFM:wnwrmpuprjo schazkis ring/HH, abnormal gastric mucosa with gastric polyps (benign)   ESOPHAGOGASTRODUODENOSCOPY (EGD) WITH PROPOFOL  N/A 12/03/2022   Procedure: ESOPHAGOGASTRODUODENOSCOPY (EGD) WITH PROPOFOL ;  Surgeon: Shaaron Lamar HERO, MD;  Location: [facility];  Service: Endoscopy;  Laterality: N/A;  1115am, asa 3, pt does not want to move up    Family History  Problem Relation Age of Onset   Pancreatic cancer Mother    Diabetes Other    Heart disease Other    Colon cancer Neg Hx     Medications Ordered Prior to Encounter[1]  Allergies[2]  Social History   Substance and Sexual Activity  Alcohol Use Not Currently   Alcohol/week: 14.0 standard drinks of alcohol   Types: 14 Glasses of wine per week    Tobacco Use History[3]  Review of Systems  Constitutional: Negative.   HENT: Negative.    Eyes:  Positive for blurred vision.  Respiratory: Negative.    Cardiovascular: Negative.   Gastrointestinal: Negative.   Genitourinary: Negative.   Musculoskeletal: Negative.   Skin: Negative.   Neurological: Negative.   Endo/Heme/Allergies: Negative.   Psychiatric/Behavioral: Negative.      Objective   Vitals:   05/17/24 1227  BP: 134/72  Pulse: 68  Resp: 18  Temp: 98 F (36.7 C)  SpO2: 99%    Physical Exam Vitals reviewed.  Constitutional:      Appearance: Normal appearance. She is normal weight. She is not ill-appearing.  HENT:     Head: Normocephalic and atraumatic.  Cardiovascular:     Rate and Rhythm: Normal rate and regular rhythm.     Heart sounds: Normal heart sounds. No murmur heard.    No friction rub. No gallop.  Pulmonary:     Effort: Pulmonary effort is normal. No  respiratory distress.     Breath sounds: Normal breath sounds. No stridor. No wheezing, rhonchi or rales.  Lymphadenopathy:     Cervical: No cervical adenopathy.  Skin:    General: Skin is warm and dry.  Neurological:     Mental Status: She is alert and oriented to person, place, and time.   Breast: Dominant 3 cm mass noted in the upper, outer quadrant of the left breast.  No axillary lymphadenopathy noted.  Right breast examination unremarkable.  No dominant mass, nipple discharge, or dimpling noted.  The axilla  was negative for palpable nodes.  Both breasts are pendulous in nature.  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 Davonna of oncology later this week.  I told her the surgical options include a partial mastectomy with sentinel lymph node biopsy versus modified radical mastectomy.  Should she undergo surgical intervention, I would have to recheck her platelet count as she is at increased risk for postoperative hematoma.  I will see her again in the office in 2 weeks to discuss what she wants to do.    [1]  Current Outpatient Medications on File Prior to Visit  Medication Sig Dispense Refill   acetaminophen (TYLENOL) 325 MG tablet Take 650 mg by mouth every 6 (six) hours as needed.     clonazePAM (KLONOPIN) 0.5 MG tablet Take 0.25 mg by mouth daily.     CONSTULOSE  10 GM/15ML solution TAKE 15 MLS BY MOUTH DAILY 948 mL 0   diclofenac Sodium (VOLTAREN) 1 % GEL Apply 2 g topically daily as needed (arthritis pain).     fluticasone (FLONASE) 50 MCG/ACT nasal spray Place 2 sprays into both nostrils daily as needed for allergies.     pantoprazole  (PROTONIX ) 40 MG tablet TAKE 1 TABLET(40 MG) BY MOUTH TWICE DAILY 180 tablet 1   No current facility-administered medications on file prior to visit.  [2]  Allergies Allergen Reactions   Codeine Nausea And Vomiting   Mucinex [Guaifenesin Er] Nausea And Vomiting  [3]  Social History Tobacco Use  Smoking Status Former   Current packs/day: 0.00   Average packs/day: 1.5 packs/day for 20.0 years (30.0 ttl pk-yrs)   Types: Cigarettes   Start date: 79   Quit date: 51   Years since quitting: 42.1   Passive exposure: Past  Smokeless Tobacco Never

## 2024-05-19 ENCOUNTER — Inpatient Hospital Stay: Attending: Oncology | Admitting: Oncology

## 2024-05-19 ENCOUNTER — Inpatient Hospital Stay

## 2024-05-19 VITALS — BP 135/55 | HR 78 | Temp 97.9°F | Resp 18 | Ht 65.35 in | Wt 154.0 lb

## 2024-05-19 DIAGNOSIS — K7030 Alcoholic cirrhosis of liver without ascites: Secondary | ICD-10-CM | POA: Insufficient documentation

## 2024-05-19 DIAGNOSIS — Z171 Estrogen receptor negative status [ER-]: Secondary | ICD-10-CM | POA: Insufficient documentation

## 2024-05-19 DIAGNOSIS — K7682 Hepatic encephalopathy: Secondary | ICD-10-CM | POA: Diagnosis not present

## 2024-05-19 DIAGNOSIS — C50912 Malignant neoplasm of unspecified site of left female breast: Secondary | ICD-10-CM | POA: Insufficient documentation

## 2024-05-19 DIAGNOSIS — C50412 Malignant neoplasm of upper-outer quadrant of left female breast: Secondary | ICD-10-CM | POA: Diagnosis not present

## 2024-05-19 LAB — IRON AND TIBC
Iron: 40 ug/dL (ref 28–170)
Saturation Ratios: 9 % — ABNORMAL LOW (ref 10.4–31.8)
TIBC: 465 ug/dL — ABNORMAL HIGH (ref 250–450)
UIBC: 425 ug/dL

## 2024-05-19 LAB — CBC WITH DIFFERENTIAL/PLATELET
Abs Immature Granulocytes: 0.01 10*3/uL (ref 0.00–0.07)
Basophils Absolute: 0 10*3/uL (ref 0.0–0.1)
Basophils Relative: 1 %
Eosinophils Absolute: 0.2 10*3/uL (ref 0.0–0.5)
Eosinophils Relative: 4 %
HCT: 31.3 % — ABNORMAL LOW (ref 36.0–46.0)
Hemoglobin: 9.6 g/dL — ABNORMAL LOW (ref 12.0–15.0)
Immature Granulocytes: 0 %
Lymphocytes Relative: 26 %
Lymphs Abs: 1.2 10*3/uL (ref 0.7–4.0)
MCH: 28.6 pg (ref 26.0–34.0)
MCHC: 30.7 g/dL (ref 30.0–36.0)
MCV: 93.2 fL (ref 80.0–100.0)
Monocytes Absolute: 0.4 10*3/uL (ref 0.1–1.0)
Monocytes Relative: 9 %
Neutro Abs: 2.8 10*3/uL (ref 1.7–7.7)
Neutrophils Relative %: 60 %
Platelets: 96 10*3/uL — ABNORMAL LOW (ref 150–400)
RBC: 3.36 MIL/uL — ABNORMAL LOW (ref 3.87–5.11)
RDW: 14.6 % (ref 11.5–15.5)
WBC: 4.6 10*3/uL (ref 4.0–10.5)
nRBC: 0 % (ref 0.0–0.2)

## 2024-05-19 LAB — COMPREHENSIVE METABOLIC PANEL WITH GFR
ALT: 11 U/L (ref 0–44)
AST: 24 U/L (ref 15–41)
Albumin: 4.3 g/dL (ref 3.5–5.0)
Alkaline Phosphatase: 69 U/L (ref 38–126)
Anion gap: 14 (ref 5–15)
BUN: 10 mg/dL (ref 8–23)
CO2: 22 mmol/L (ref 22–32)
Calcium: 9.3 mg/dL (ref 8.9–10.3)
Chloride: 107 mmol/L (ref 98–111)
Creatinine, Ser: 0.83 mg/dL (ref 0.44–1.00)
GFR, Estimated: >60 mL/min
Glucose, Bld: 139 mg/dL — ABNORMAL HIGH (ref 70–99)
Potassium: 3.9 mmol/L (ref 3.5–5.1)
Sodium: 143 mmol/L (ref 135–145)
Total Bilirubin: 0.6 mg/dL (ref 0.0–1.2)
Total Protein: 6.6 g/dL (ref 6.5–8.1)

## 2024-05-19 LAB — VITAMIN B12: Vitamin B-12: 638 pg/mL (ref 180–914)

## 2024-05-19 LAB — GENETIC SCREENING ORDER

## 2024-05-19 LAB — FOLATE: Folate: >20 ng/mL

## 2024-05-19 LAB — FERRITIN: Ferritin: 17 ng/mL (ref 11–307)

## 2024-05-19 NOTE — Progress Notes (Signed)
 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 -04/19/2024: Left Breast mass biopsy.  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 Tumor cells are ER/PR negative and Her2 negative (1+) by IHC. Ki-67 80%.    History of Presenting Illness: Discussed the use of AI scribe software for clinical note transcription with the patient, who gave verbal consent to proceed.  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   She identified a palpable left breast mass during self-examination, which led to the diagnosis of stage II triple negative metaplastic breast cancer. She denies breast pain, skin changes, or nipple discharge. She has not received prior chemotherapy, surgery, or radiation for this malignancy.  She reports easy fatigability but remains active at home, caring for her grandsons and performing household tasks. She feels generally well today.  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 She has a history of hepatic encephalopathy with prior episodes of  memory loss and confusion, but no current symptoms. She does not use alcohol or tobacco.    Medical History: Past Medical History:  Diagnosis Date   Anxiety    Cirrhosis (HCC)    GERD (gastroesophageal reflux disease)    History of hiatal hernia    HOH (hard of hearing)    Macular degeneration    PONV (postoperative nausea and vomiting)     Surgical history: Past Surgical History:  Procedure Laterality Date   BREAST BIOPSY Left 04/19/2024   US  LT BREAST BX W LOC DEV 1ST LESION IMG BX SPEC US  GUIDE 04/19/2024 [facility]-UL[facility]   CATARACT EXTRACTION EXTRACAPSULAR Right 05/24/2021   Procedure: CATARACT EXTRACTION PHACO AND INTRAOCULAR LENS PLACEMENT (IOC);  Surgeon: Harrie Agent, MD;  Location: [facility];  Service: Ophthalmology;  Laterality: Right;  CDE 12.48   CATARACT EXTRACTION W/PHACO Left 06/07/2021   Procedure: CATARACT EXTRACTION PHACO AND INTRAOCULAR LENS PLACEMENT (IOC);  Surgeon: Harrie Agent, MD;  Location: [facility];  Service: Ophthalmology;  Laterality: Left;  CDE: 5.36   CHOLECYSTECTOMY  2011   Dr. Delsa   COLONOSCOPY N/A 04/05/2014   MFM:rnonwpr diverticulosis,colonic polyps removed (TA), random colon biopsies negative. next TCS 03/2021   cyst removed from right hand     ESOPHAGOGASTRODUODENOSCOPY N/A 04/05/2014   MFM:wnwrmpuprjo schazkis ring/HH, abnormal gastric mucosa with gastric polyps (benign)   ESOPHAGOGASTRODUODENOSCOPY (EGD) WITH PROPOFOL  N/A 12/03/2022   Procedure: ESOPHAGOGASTRODUODENOSCOPY (EGD) WITH PROPOFOL ;  Surgeon: Shaaron Lamar HERO, MD;  Location: [facility];  Service: Endoscopy;  Laterality: N/A;  1115am, asa 3, pt does not want to move up     Allergies:  is allergic to codeine and mucinex [guaifenesin er].  Medications:  Current Outpatient Medications  Medication Sig Dispense Refill   acetaminophen (TYLENOL) 325  MG tablet Take 650 mg by mouth every 6 (six) hours as needed.     clonazePAM (KLONOPIN) 0.5 MG tablet Take 0.25 mg by mouth daily.      CONSTULOSE  10 GM/15ML solution TAKE 15 MLS BY MOUTH DAILY 948 mL 0   diclofenac Sodium (VOLTAREN) 1 % GEL Apply 2 g topically daily as needed (arthritis pain).     fluticasone (FLONASE) 50 MCG/ACT nasal spray Place 2 sprays into both nostrils daily as needed for allergies.     pantoprazole  (PROTONIX ) 40 MG tablet TAKE 1 TABLET(40 MG) BY MOUTH TWICE DAILY 180 tablet 1   No current facility-administered medications for this visit.     Physical Examination: ECOG PERFORMANCE STATUS: 1 - Symptomatic but completely ambulatory  Vitals:   05/19/24 1337  BP: (!) 135/55  Pulse: 78  Resp: 18  Temp: 97.9 F (36.6 C)  SpO2: 100%   Filed Weights   05/19/24 1337  Weight: 154 lb (69.9 kg)    GENERAL:alert, no distress and comfortable SKIN: skin color, texture, turgor are normal, no rashes or significant lesions BREAST: Right breast: normal exam, no lumps palpated. Left breast: Palpable approx 3cm round mass in the lower outer quadrant of the breast. Non tender, no discharge. No axillary lymphadenopathy palpated.  LYMPH:  no palpable lymphadenopathy in the cervical, axillary or inguinal LUNGS: clear to auscultation and percussion with normal breathing effort HEART: regular rate & rhythm and no murmurs and no lower extremity edema ABDOMEN:abdomen soft, non-tender and normal bowel sounds Musculoskeletal:no cyanosis of digits and no clubbing  PSYCH: alert & oriented x 3 with fluent speech   Laboratory Data: I have reviewed the data as listed Lab Results  Component Value Date   WBC 4.6 05/19/2024   HGB 9.6 (L) 05/19/2024   HCT 31.3 (L) 05/19/2024   MCV 93.2 05/19/2024   PLT 96 (L) 05/19/2024      Chemistry      Component Value Date/Time   NA 143 05/19/2024 1431   NA 142 11/18/2023 1126   K 3.9 05/19/2024 1431   CL 107 05/19/2024 1431   CO2 22 05/19/2024 1431   BUN 10 05/19/2024 1431   BUN 7 (L) 11/18/2023 1126   CREATININE 0.83 05/19/2024 1431   CREATININE 0.82 02/23/2023 1340       Component Value Date/Time   CALCIUM 9.3 05/19/2024 1431   ALKPHOS 69 05/19/2024 1431   AST 24 05/19/2024 1431   ALT 11 05/19/2024 1431   BILITOT 0.6 05/19/2024 1431   BILITOT 0.8 11/18/2023 1126       Radiographic Studies: I have personally reviewed the radiological images as listed and agreed with the findings in the report.  US  LT BREAST BX W LOC DEV 1ST LESION IMG BX SPEC US  GUIDE Addendum: ADDENDUM REPORT: 04/20/2024 15:00   ADDENDUM:  PATHOLOGY revealed: A. LEFT BREAST, MASS AT 1:00 4 CM FROM NIPPLE  BIOPSY- Invasive poorly differentiated ductal adenocarcinoma with  focal metaplastic fibromyxoid and chondroid stroma, grade 3- Overall  grade: 3- Angiolymphatic invasion not identified- Tumor measures 16  mm in greatest linear extent   Pathology results are CONCORDANT with imaging findings, per Dr.  Aliene Mir.   Pathology results and recommendations below were discussed with  patient by telephone. Patient reported biopsy site doing well with  no adverse symptoms, and only slight tenderness at the site. Post  biopsy care instructions were reviewed, questions were answered and  my direct phone number was provided. Patient was instructed to call  [facility] Mammography Department for any  additional questions or concerns related to biopsy site.   RECOMMENDATION: Surgical and oncological consultation. Request for  surgical and oncological consultation was relayed to Ronal Gander  and Joesph Bohr, Nurse Navigator at [facility].   Pathology results reported by Mliss CHARM Molt RN 04/20/2024.   Electronically Signed    By: Aliene Lloyd M.D.    On: 04/20/2024 15:00 Narrative: CLINICAL DATA:  79 year old woman with suspicious 2.8 cm LEFT breast mass (1 o'clock 4 CMFN) presents for ultrasound-guided core needle biopsy.  EXAM: ULTRASOUND GUIDED LEFT BREAST CORE NEEDLE BIOPSY  COMPARISON:  Previous  exam(s).  PROCEDURE: I met with the patient and we discussed the procedure of ultrasound-guided biopsy, including benefits and alternatives. We discussed the high likelihood of a successful procedure. We discussed the risks of the procedure, including infection, bleeding, tissue injury, clip migration, and inadequate sampling. Informed written consent was given. The usual time-out protocol was performed immediately prior to the procedure.  Lesion quadrant: Upper outer quadrant  Using sterile technique and 1% Lidocaine  as local anesthetic, under direct ultrasound visualization, a 12 gauge spring-loaded device was used to perform biopsy of 2.8 cm LEFT breast mass using a medial approach. At the conclusion of the procedure ribbon shaped tissue marker clip was deployed into the biopsy cavity. Follow up 2 view mammogram was performed and dictated separately.  IMPRESSION: Ultrasound guided biopsy of 2.8 cm LEFT breast mass (1 o'clock 4 CMFN). No apparent complications.  Electronically Signed: By: Aliene Lloyd M.D. On: 04/19/2024 09:53    ASSESSMENT & PLAN:  Patient is a 79 y.o. 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 Stage II without significant lymphadenopathy. Triple negative and metaplastic features necessitate prompt aggressive therapy. Curative intent.  Age and cirrhosis influence therapy selection and dosing.  Neoadjuvant chemotherapy and immunotherapy indicated to shrink tumor, reduce recurrence risk, and improve surgical outcomes.  - Discussed neoadjuvant chemotherapy with platinum-based agents and pembrolizumab for six cycles (~4.5 months) prior to surgery. Prefer anthracycline free regimen for better tolerability considering age and co morbidities - Surgery  post-chemotherapy . - Post-surgical management based on pathology: additional chemotherapy or oral agents if residual disease; additional immunotherapy cycles if no residual disease. - Annual mammography for surveillance post-treatment. - Genetic testing planned due to triple negative breast cancer and family history. - Ordered baseline blood work and MRI before and after chemotherapy to assess response. - Provided written information about chemotherapy and immunotherapy regimens. - Arranged follow-up phone call to discuss chemotherapy decision. - Discussed need for port placement if chemotherapy initiated.  Will follow up with patient for treatment decision and management.   Cirrhosis of liver Chronic liver disease secondary to prior alcohol use and fatty liver disease. Cirrhosis impacts chemotherapy dosing and increases cytopenia risk. Requires dosing adjustment based on hepatic function with close monitoring.  - Chemotherapy dosing adjusted based on liver function - Ordered baseline laboratory studies to assess liver function prior to chemotherapy. - Will coordinate care with hepatology (Dr. Thomasina).  History of hepatic encephalopathy Managed with lactulose .  - Continue lactulose  for management.     Orders Placed This Encounter  Procedures   MR Breast Bilateral W Contrast    Standing Status:   Future    Expected Date:  05/26/2024    Expiration Date:   05/19/2025    Reason for Exam (SYMPTOM  OR DIAGNOSIS REQUIRED):   triple negative breast cancer staging    If indicated for the ordered procedure, I authorize the administration of contrast media per Radiology protocol:   Yes    What is the patient's sedation requirement?:   No Sedation    Does the patient have a pacemaker or implanted devices?:   No    Preferred imaging location?:   [facility] (table limit - 500lbs)    Release to patient:   Immediate   CBC with Differential    Standing Status:   Future    Number of  Occurrences:   1    Expected Date:   05/19/2024    Expiration Date:   08/17/2024   Comprehensive metabolic panel    Standing Status:   Future    Number of Occurrences:   1    Expected Date:   05/19/2024    Expiration Date:   08/17/2024   Cancer antigen 27.29    Standing Status:   Future    Number of Occurrences:   1    Expected Date:   05/19/2024    Expiration Date:   08/17/2024   Cancer antigen 15-3    Standing Status:   Future    Number of Occurrences:   1    Expected Date:   05/19/2024    Expiration Date:   08/17/2024   Iron and TIBC ([facility] ONLY)    Standing Status:   Future    Number of Occurrences:   1    Expected Date:   05/19/2024    Expiration Date:   08/17/2024   Ferritin    Standing Status:   Future    Number of Occurrences:   1    Expected Date:   05/19/2024    Expiration Date:   08/17/2024   Vitamin B12    Standing Status:   Future    Number of Occurrences:   1    Expected Date:   05/19/2024    Expiration Date:   08/17/2024   Folate    Standing Status:   Future    Number of Occurrences:   1    Expected Date:   05/19/2024    Expiration Date:   08/17/2024   Genetic Screening Order    Standing Status:   Future    Number of Occurrences:   1    Expected Date:   05/19/2024    Expiration Date:   05/19/2025   Ambulatory referral to Genetics    Referral Priority:   Routine    Referral Type:   Consultation    Referral Reason:   Specialty Services Required    Number of Visits Requested:   1    The total time spent in the appointment was 60 minutes encounter with patients including review of chart and various tests results, discussions about plan of care and coordination of care plan   All questions were answered. The patient knows to call the clinic with any problems, questions or concerns. No barriers to learning was detected.   Nicole Barrett,acting as a neurosurgeon for Mickiel Dry, MD.,have documented all relevant documentation on the behalf of Mickiel Dry, MD,as directed by  Mickiel Dry, MD while in the presence of Mickiel Dry, MD.  I, Mickiel Dry MD, have reviewed the above documentation for accuracy and completeness, and I agree with the  above.    Mickiel Dry, MD 1/31/202610:57 AM "

## 2024-05-19 NOTE — Patient Instructions (Addendum)
 [facility]  Discharge Instructions  You were seen and examined today by Dr. Davonna. Dr. Davonna is a medical oncologist, meaning that she specializes in the treatment of cancer diagnoses. Dr. Davonna discussed your past medical history, family history of cancers, and the events that led to you being here today.  You were referred to Dr. Davonna due to a new diagnosis of Stage II Triple Negative Breast Cancer. This does mean that it is a more aggressive form of breast cancer. This is typically treated with chemotherapy and immunotherapy followed by surgery with additional chemotherapy and radiation therapy if indicated.  If you proceed with surgery prior to chemotherapy or without chemotherapy at all, there is a higher risk of cancer recurrence.  In preparation for any treatment, Dr. Davonna will arrange for a breast MRI.  Please let us  know how you would like to proceed. You can call your Nurse Navigator, Joesph, at [phone].  Thank you for choosing [facility] to provide your oncology and hematology care.   To afford each patient quality time with our provider, please arrive at least 15 minutes before your scheduled appointment time. You may need to reschedule your appointment if you arrive late (10 or more minutes). Arriving late affects you and other patients whose appointments are after yours.  Also, if you miss three or more appointments without notifying the office, you may be dismissed from the clinic at the providers discretion.    Again, thank you for choosing [facility].  Our hope is that these requests will decrease the amount of time that you wait before being seen by our physicians.   If you have a lab appointment with the Cancer Center - please note that after April 8th, all labs will be drawn in the cancer center.  You do not have to check in or register with the main entrance as you have in the past but  will complete your check-in at the cancer center.            _____________________________________________________________  Should you have questions after your visit to [facility], please contact our office at [phone] and follow the prompts.  Our office hours are 8:00 a.m. to 4:30 p.m. Monday - Thursday and 8:00 a.m. to 2:30 p.m. Friday.  Please note that voicemails left after 4:00 p.m. may not be returned until the following business day.  We are closed weekends and all major holidays.  You do have access to a nurse 24-7, just call the main number to the clinic [phone] and do not press any options, hold on the line and a nurse will answer the phone.    For prescription refill requests, have your pharmacy contact our office and allow 72 hours.    Masks are no longer required in the cancer centers. If you would like for your care team to wear a mask while they are taking care of you, please let them know. You may have one support person who is at least 80 years old accompany you for your appointments.

## 2024-05-20 LAB — CANCER ANTIGEN 15-3: CA 15-3: 68 U/mL — ABNORMAL HIGH (ref 0.0–25.0)

## 2024-05-20 LAB — CANCER ANTIGEN 27.29: CA 27.29: 87.1 U/mL — ABNORMAL HIGH (ref 0.0–38.6)

## 2024-05-26 ENCOUNTER — Other Ambulatory Visit: Payer: Self-pay | Admitting: *Deleted

## 2024-05-26 DIAGNOSIS — D649 Anemia, unspecified: Secondary | ICD-10-CM

## 2024-05-26 DIAGNOSIS — R188 Other ascites: Secondary | ICD-10-CM

## 2024-05-26 NOTE — Addendum Note (Signed)
 Addended by: Bobbi Yount L on: 05/26/2024 01:51 PM   Modules accepted: Orders

## 2024-05-27 DIAGNOSIS — D509 Iron deficiency anemia, unspecified: Secondary | ICD-10-CM | POA: Insufficient documentation

## 2024-05-31 ENCOUNTER — Ambulatory Visit: Admitting: General Surgery

## 2024-06-01 ENCOUNTER — Ambulatory Visit (HOSPITAL_COMMUNITY)

## 2024-06-02 ENCOUNTER — Inpatient Hospital Stay: Admitting: Licensed Clinical Social Worker

## 2024-06-02 ENCOUNTER — Inpatient Hospital Stay

## 2024-06-02 DIAGNOSIS — D509 Iron deficiency anemia, unspecified: Secondary | ICD-10-CM

## 2024-06-09 ENCOUNTER — Inpatient Hospital Stay
